# Patient Record
Sex: Male | Born: 1965 | Race: Black or African American | Hispanic: No | Smoking: Current some day smoker
Health system: Southern US, Community
[De-identification: ages and names within clinical notes are randomized; demographics above are authoritative.]

## PROBLEM LIST (undated history)

## (undated) DIAGNOSIS — I10 Essential (primary) hypertension: Secondary | ICD-10-CM

## (undated) DIAGNOSIS — E785 Hyperlipidemia, unspecified: Secondary | ICD-10-CM

## (undated) DIAGNOSIS — N200 Calculus of kidney: Secondary | ICD-10-CM

## (undated) DIAGNOSIS — M1990 Unspecified osteoarthritis, unspecified site: Secondary | ICD-10-CM

## (undated) HISTORY — DX: Hyperlipidemia, unspecified: E78.5

## (undated) HISTORY — DX: Unspecified osteoarthritis, unspecified site: M19.90

## (undated) HISTORY — PX: KIDNEY STONE SURGERY: SHX686

---

## 1998-03-14 ENCOUNTER — Emergency Department (HOSPITAL_COMMUNITY): Admission: EM | Admit: 1998-03-14 | Discharge: 1998-03-14 | Payer: Self-pay | Admitting: Emergency Medicine

## 1999-05-19 ENCOUNTER — Emergency Department (HOSPITAL_COMMUNITY): Admission: EM | Admit: 1999-05-19 | Discharge: 1999-05-19 | Payer: Self-pay | Admitting: Emergency Medicine

## 2000-09-03 ENCOUNTER — Emergency Department (HOSPITAL_COMMUNITY): Admission: EM | Admit: 2000-09-03 | Discharge: 2000-09-03 | Payer: Self-pay | Admitting: Emergency Medicine

## 2001-10-29 ENCOUNTER — Encounter: Payer: Self-pay | Admitting: Internal Medicine

## 2001-10-29 ENCOUNTER — Inpatient Hospital Stay (HOSPITAL_COMMUNITY): Admission: EM | Admit: 2001-10-29 | Discharge: 2001-10-31 | Payer: Self-pay | Admitting: Emergency Medicine

## 2001-10-29 ENCOUNTER — Encounter: Payer: Self-pay | Admitting: General Surgery

## 2001-10-29 ENCOUNTER — Encounter: Payer: Self-pay | Admitting: Emergency Medicine

## 2001-10-29 ENCOUNTER — Emergency Department (HOSPITAL_COMMUNITY): Admission: EM | Admit: 2001-10-29 | Discharge: 2001-10-29 | Payer: Self-pay | Admitting: Emergency Medicine

## 2001-10-30 ENCOUNTER — Encounter: Payer: Self-pay | Admitting: Internal Medicine

## 2001-10-31 ENCOUNTER — Encounter: Payer: Self-pay | Admitting: Internal Medicine

## 2002-03-30 ENCOUNTER — Encounter: Payer: Self-pay | Admitting: Emergency Medicine

## 2002-03-30 ENCOUNTER — Emergency Department (HOSPITAL_COMMUNITY): Admission: EM | Admit: 2002-03-30 | Discharge: 2002-03-30 | Payer: Self-pay | Admitting: Emergency Medicine

## 2002-04-02 ENCOUNTER — Encounter: Payer: Self-pay | Admitting: Gastroenterology

## 2002-04-02 ENCOUNTER — Encounter: Payer: Self-pay | Admitting: *Deleted

## 2002-04-02 ENCOUNTER — Ambulatory Visit (HOSPITAL_COMMUNITY): Admission: RE | Admit: 2002-04-02 | Discharge: 2002-04-02 | Payer: Self-pay | Admitting: *Deleted

## 2002-04-02 ENCOUNTER — Inpatient Hospital Stay (HOSPITAL_COMMUNITY): Admission: EM | Admit: 2002-04-02 | Discharge: 2002-04-06 | Payer: Self-pay | Admitting: Emergency Medicine

## 2004-01-24 ENCOUNTER — Emergency Department (HOSPITAL_COMMUNITY): Admission: EM | Admit: 2004-01-24 | Discharge: 2004-01-24 | Payer: Self-pay | Admitting: Family Medicine

## 2004-11-10 ENCOUNTER — Emergency Department (HOSPITAL_COMMUNITY): Admission: EM | Admit: 2004-11-10 | Discharge: 2004-11-10 | Payer: Self-pay | Admitting: *Deleted

## 2005-11-23 ENCOUNTER — Encounter: Payer: Self-pay | Admitting: Emergency Medicine

## 2006-02-02 ENCOUNTER — Emergency Department (HOSPITAL_COMMUNITY): Admission: EM | Admit: 2006-02-02 | Discharge: 2006-02-02 | Payer: Self-pay | Admitting: Emergency Medicine

## 2007-01-11 ENCOUNTER — Emergency Department (HOSPITAL_COMMUNITY): Admission: EM | Admit: 2007-01-11 | Discharge: 2007-01-11 | Payer: Self-pay | Admitting: Family Medicine

## 2010-05-01 ENCOUNTER — Emergency Department (HOSPITAL_BASED_OUTPATIENT_CLINIC_OR_DEPARTMENT_OTHER): Admission: EM | Admit: 2010-05-01 | Discharge: 2010-05-01 | Payer: Self-pay | Admitting: Emergency Medicine

## 2010-10-02 ENCOUNTER — Inpatient Hospital Stay (INDEPENDENT_AMBULATORY_CARE_PROVIDER_SITE_OTHER)
Admission: RE | Admit: 2010-10-02 | Discharge: 2010-10-02 | Disposition: A | Discharge status: Home / Self Care | Payer: Self-pay | Source: Ambulatory Visit | Attending: Emergency Medicine | Admitting: Emergency Medicine

## 2010-10-02 DIAGNOSIS — L509 Urticaria, unspecified: Secondary | ICD-10-CM

## 2010-10-07 LAB — URINALYSIS, ROUTINE W REFLEX MICROSCOPIC
Bilirubin Urine: NEGATIVE
Ketones, ur: NEGATIVE mg/dL
Nitrite: NEGATIVE
Protein, ur: NEGATIVE mg/dL
Urobilinogen, UA: 0.2 mg/dL (ref 0.0–1.0)

## 2010-10-07 LAB — RPR: RPR Ser Ql: NONREACTIVE

## 2010-10-07 LAB — URINE CULTURE

## 2010-10-07 LAB — GC/CHLAMYDIA PROBE AMP, GENITAL
Chlamydia, DNA Probe: NEGATIVE
GC Probe Amp, Genital: POSITIVE — AB

## 2010-12-10 NOTE — Consult Note (Signed)
[place]. [facility]  Patient:    KETRICK, MATNEY Visit Number: [id number] MRN: [id number]          Service Type: EMS Location: [place] Attending Physician:  Cathren Laine Dictated by:   Roosvelt Harps, M.D. Proc. Date: 10/29/01 Admit Date:  10/29/2001   CC:         Dineen Kid. Reche Dixon, M.D.   Consultation Report  HISTORY OF PRESENT ILLNESS:  Mr. Shellhammer is a 45 year old male whom I am asked to see by Drs. Rama and Reche Dixon for severe abdominal pain.  Patient is a relatively poor historian.  He has variously told people that the pain is in the epigastrium, the left upper quadrant, the right lower quadrant, and the right upper quadrant.  To me, he says the pain is diffuse.  It began acutely this morning and was followed by nausea and vomiting.  He was seen in the emergency room where a CT scan was performed and was read as negative.  I have personally reviewed the CT scan with the radiologist and confirmed the negative findings.  This morning, his white blood count was 9.  It has risen to 13.  The pain has persisted and he has had nausea and vomiting, but he has had no diarrhea, melena, hematochezia, or hematemesis.  Rectal exam was guaiac negative earlier today.  He denies any prior history of similar pain.  He has no history of ulcer disease.  He says that no one at home is sick and he ate no questionable foods recently.  he also says he has not been drinking any alcohol or used any recent drugs.  He denies dysphagia, weight loss, or other chronic GI problems.  PAST MEDICAL HISTORY:  Fairly unremarkable except for a history of polysubstance abuse inactive for the past year.  FAMILY HISTORY:  Negative for ulcers, gallstones, inflammatory bowel disease, or colorectal neoplasia.  SOCIAL HISTORY:  He is married with three children, lives at home, smokes a half a pack of cigarettes per week, has used cocaine and alcohol in the past but none in the past  year.  REVIEW OF SYSTEMS:  GENERAL:  No weight loss or night sweats.  SKIN:  No rash or pruritus.  EYES:  No icterus or change in vision.  ENT:  No aphthous ulcers or chronic sore throat.  RESPIRATORY:  No shortness of breath, cough, or wheezing.  CARDIAC:  No chest pain, palpitations, or history of valvular heart disease.  GI:  As above.  GU:  No dysuria or hematuria.  Remainder of review of systems is negative.  PHYSICAL EXAMINATION:  GENERAL:  He is a well-developed, well-nourished adult male in moderate distress having a shaking chill at the time of my exam.  VITAL SIGNS:  He has no fever currently; however, blood pressure is 149/77, pulse 63.  SKIN:  Normal.  HEENT:  Eyes are anicteric.  Conjunctivae pink.  Oropharynx unremarkable. There is no cervical or inguinal adenopathy.  CHEST:  Chest sounds are clear.  HEART:  Heart sounds are regular rate and rhythm.  ABDOMEN:  Soft with minimal guarding and diffuse tenderness to deep palpation. There may be questionable diffuse mild rebound.  There are hypoactive bowel sounds.  Murphys sign is negative.  RECTAL:  Exam is not repeated.  EXTREMITIES:  Without cyanosis, clubbing, edema, or rash.  LABORATORY TESTS:  Hemoglobin 15, white blood count 13, platelet count normal. Potassium was 3.3.  Remainder of electrolytes, BUN, creatinine, blood sugar are normal.  Total bilirubin was 1.7, but all other liver function tests are normal.  Amylase and lipase are normal.  Urinalysis is negative.  IMPRESSION:  A 45 year old male with a probable acute infectious gastroenteritis.  Other unlikely possibilities would be cholecystitis or a penetrating ulcer.  In light of his lack of nonsteroidal use and no history of prior ulcer disease and guaiac-negative stool with normal hemoglobin and no hematemesis, I am extremely doubtful that he has acute ulcer disease at this time.  RECOMMENDATIONS:  I agree with keeping him on IV Protonix and  observation and hydration.  If his abdominal symptoms continue, a three-way abdominal series should be ordered.  If his white blood count continues to rise and/or he has fevers, blood cultures and coverage with antibiotics would be in order.  If there are any further abdominal signs on physical exam or a progression to overt rebound, surgical consultation is in order.  I will follow the patient along with you, but I doubt that any endoscopic procedures are urgently indicated.Dictated by:   Roosvelt Harps, M.D. Attending Physician:  Cathren Laine DD:  10/29/01 TD:  10/29/01 Job: [id number]

## 2010-12-10 NOTE — H&P (Signed)
NAME:  Timothy Harrington, Timothy Harrington                         ACCOUNT NO.:  [id number]   MEDICAL RECORD NO.:  [id number]                   PATIENT TYPE:  INP   LOCATION:  5502                                 FACILITY:  [facility]   PHYSICIAN:  James L. Malon Kindle., M.D.          DATE OF BIRTH:  Sep 19, 1965   DATE OF ADMISSION:  04/02/2002  DATE OF DISCHARGE:                                HISTORY & PHYSICAL   REASON FOR ADMISSION:  Abdominal pain.   HISTORY OF PRESENT ILLNESS:  A 45 year old black male who has been evaluated  by Dr. Luther Parody for abdominal pain with fluctuating LFTs.  He had an ERCP  today with, I believe, a sphincterotomy according to the patient's wife.  I  am not sure if stones were found.  There was some question about his  gallbladder needing to be removed.  I do not have those records currently  available to me.  He went home following the procedure after he had been  observed for two to three hours in the endoscopy suite and apparently was  doing fine.  He went home, was sleepy and groggy according to his wife, and  he went to sleep. When he woke up, he was experiencing abdominal pain that  has gotten progressively worse over the past several hours.  He is  nauseated.  The pain does not radiate to the back, seems to be in the  epigastrium and quite severe.  He has not eaten anything, although he is  quite thirsty.  His wife notes he has urinated a couple of times since being  home.   CURRENT MEDICATIONS:  URSO, samples just given to him by Dr. Luther Parody.  He  takes no other medicines chronically.   ALLERGIES:  No known drug allergies   PAST MEDICAL HISTORY:  No previous surgeries.  No chronic medical problems.   FAMILY HISTORY:  His parents are both living and reasonably healthy.   SOCIAL HISTORY:  He is married, works in [employer].  Drank socially  in the past, has not had any alcohol in one to two years.  He smoked in the  past but has not smoked recently.   REVIEW OF SYSTEMS:  Remarkable for lack of any other chronic medical  symptoms or problems.  He has had fluctuating abdominal pain that comes and  goes for sometime.   PHYSICAL EXAMINATION:  VITAL SIGNS:  The patient is afebrile with stable  vital signs.  GENERAL:  Pleasant black male who is nonicteric.  He is alert and oriented.  HEART:  Regular rate and rhythm with no murmur, rub, or gallop.  LUNGS:  Clear.  ABDOMEN:  Distended, quiet, diffusely tender mostly in the epigastrium.  The  patient does have guarding.   LABORATORY DATA:  All labs are currently pending.    ASSESSMENT:  Severe epigastric pain following an endoscopic retrograde  cholangiopancreatography, almost certainly pancreatitis.  Other  possibilities would include perforation due to the procedure, bleeding;  however, I think those are much less likely.  I have discussed this in  detail with the patient and his wife.   PLAN:  Will admit, pain control. Will check his labs.  Will obtain an acute  abdominal series to rule out free air.                                               James L. Malon Kindle., M.D.    Waldron Session  D:  04/02/2002  T:  04/03/2002  Job:  [id number]

## 2010-12-10 NOTE — Discharge Summary (Signed)
   NAME:  Timothy Harrington, Timothy Harrington                         ACCOUNT NO.:  [id number]   MEDICAL RECORD NO.:  [id number]                   PATIENT TYPE:  INP   LOCATION:  5502                                 FACILITY:  [facility]   PHYSICIAN:  Althea Grimmer. Luther Parody, M.D.            DATE OF BIRTH:  04/21/1966   DATE OF ADMISSION:  04/02/2002  DATE OF DISCHARGE:  04/06/2002                                 DISCHARGE SUMMARY   DISCHARGE DIAGNOSES:  1. Post endoscopic retrograde cholangiopancreatography pancreatitis.  2. History of passage of probable small gallstones.   HISTORY OF PRESENT ILLNESS:  The patient was admitted the evening after  undergoing an ERCP.  He had been observed for several hours in the Endoscopy  Unit and appeared to be fine.  However, he returned complaining of abdominal  pain with nausea.  On physical exam, he was afebrile with stable vital  signs, eyes were anicteric, and abdomen was distended with hypoactive bowel  sounds and guarding.  He was admitted to the hospital for hydration and pain  medication.   HOSPITAL COURSE:  He received intravenous and then oral narcotics with  relief of pain.  Diet was progressively advanced from clear liquids to a  full diet.  At the time of discharge, he was not requiring any pain  medication.   LABORATORY DATA:  On discharge his hemoglobin was 14.6 with a white blood  count of 10.2.  Serum electrolytes were normal.  BUN 5.  Albumin 3 down from  an admission level of 3.6.  Discharge AST was 44, down from 88.  ALT was  141, down from 265.  Alkaline phosphatase 148, down from 155.  Total  bilirubin 2.2, down from 5.5.  Amylase was 205, down from 2317.  Lipase was  52.   DISCHARGE CONDITION:  Improved.   DISCHARGE MEDICATIONS:  The patient was sent home on ursodeoxycholic acid to  hopefully prevent further gallstone formation.   FOLLOW UP:  He is advised to followup in the office within 10 days.     Althea Grimmer. Luther Parody, M.D.    PJS/MEDQ  D:  04/17/2002  T:  04/20/2002  Job:  [id number]

## 2010-12-10 NOTE — Op Note (Signed)
NAME:  MEREDITH, MELLS                         ACCOUNT NO.:  [id number]   MEDICAL RECORD NO.:  [id number]                   PATIENT TYPE:  INP   LOCATION:  5502                                 FACILITY:  [facility]   PHYSICIAN:  Althea Grimmer. Luther Parody, M.D.            DATE OF BIRTH:  12-20-1965   DATE OF PROCEDURE:  04/02/2002  DATE OF DISCHARGE:                                 OPERATIVE REPORT   PROCEDURE:  Endoscopic retrograde cholangiopancreatography with  sphincterotomy.   INDICATIONS:  A 44 year old male with recurrent severe epigastric pain and  abnormal liver function tests highly suggestive of passage of small common  bile duct stones.   PREPARATION:  He is NPO since midnight.   PREPROCEDURE MEDICATIONS:  He received a total of 130 mg of Demerol and 13  mg of Versed prior to and during the procedure.  He was also given  ciprofloxacin 400 mg IV prior to the procedure and received Glucagon 0.25 mg  during the procedure.  His throat was anesthetized with Cetacaine spray.   DESCRIPTION OF PROCEDURE:  The Olympus video duodenoscope was inserted via  the mouth and advanced easily through the upper esophageal sphincter into  the stomach and then into the duodenum.  The ampulla was visualized and  cannulated, with the sphincterotome pre-loaded with the guidewire.  Several  brief injections were made into the pancreatic duct.  No secondary ducts  were filled.  The pancreatogram appeared normal.  The cannula was  repositioned and the common bile duct was entered.  The guidewire was  advanced well into a right hepatic duct and a small to moderate  sphincterotomy performed.  A balloon catheter exchange with an 8 mm balloon  was made, and this was advanced to the bifurcation.  The balloon was  inflated, and dye was injected.  This was swept through the common bile duct  twice without any obvious stones being delivered.  The cholangiogram  appeared normal, and this was reviewed with Arnell Sieving, M.D., in  radiology.  The patient tolerated the procedure well.  Pulse, blood  pressure, and oximetry testing remained stable throughout.  He will be  observed in recovery for several hours before a decision is made regarding  admission versus discharge home.   IMPRESSION:  Status post endoscopic retrograde cholangiopancreatography with  sphincterotomy for history of probable passage of small common bile duct  stones.  Cholangiogram, however, appears normal.   PLAN:  If the patient is discharged, he will be seen within the week in the  office.  He will be discharged on ursodeoxycholic acid and a low-fat diet.  He is advised to have surgical consultation, which he is currently  deferring.  Althea Grimmer. Luther Parody, M.D.    PJS/MEDQ  D:  04/02/2002  T:  04/03/2002  Job:  [id number]

## 2010-12-10 NOTE — Discharge Summary (Signed)
[place]  Patient:    Timothy Harrington, Timothy Harrington Visit Number: [id number] MRN: [id number]          Service Type: MED Location: [phone] Attending Physician:  Alfonso Ramus Dictated by:   Hillery Aldo, M.D. Admit Date:  10/29/2001 Discharge Date: 10/31/2001   CC:         Dr. Derrill Memo GI  Outpatient Clinic/[place]   Discharge Summary  DISCHARGE DIAGNOSES: 1. Abdominal pain of uncertain etiology. 2. Hypokalemia. 3. Nausea and vomiting. 4. Fever.  DISCHARGE MEDICATIONS WITH DOSAGES: 1. Augmentin 875 mg p.o. b.i.d. times seven days. 2. Zithromax 250 mg p.o. q.d. times three days. 3. Omeprazole 20 mg p.o. q.d. times 30 days.  FOLLOW-UP APPOINTMENTS: 1. The patient will follow-up at the outpatient clinic on Monday, November 05, 2001 at 2 p.m. for hospital follow-up. 2. Additionally he is instructed to call Dr. Henderson [place] office for gastrointestinal follow-up.  DISPOSITION: The patient is discharged home in stable condition.  ACTIVITY:  He is instructed to resume activity as tolerated.  DIET:  He is instructed to resume diet as tolerated.  SPECIAL INSTRUCTIONS: He understands that he should return to the ER or call the clinic office if he has worsening abdominal pain or fever.  PROCEDURES AND DIAGNOSTIC STUDIES: 1. October 29, 2001:  CT scan of the abdomen -  no active disease, negative for    appendicitis. 2. October 29, 2001:  Three way abdomen - no free air, no active disease. 3. October 29, 2001:  Repeat CT scan of the abdomen - no active abdominal    process.  Right middle lobe consolidation.  CONSULTATIONS: 1. Dr. Angelene Giovanni, Gastroenterology. 2. Dr. Derrell Lolling, Surgery.  BRIEF ADMISSION HISTORY AND PHYSICAL: The patient is a 45 year-old old African-American male who awoke the morning of admission at 3 a.m. with complaints of severe abdominal pain.  He was treated in the emergency department with antiemetics,  pain medications and discharged after the first CT scan revealed no active abdominal pathology. He was instructed to return to the emergency department if he developed any worsening of symptoms or nausea and vomiting.  He returned approximately two hours later with ongoing complaints of severe abdominal pain and nausea with vomiting up the contrast material he had for his CT scan.  The patient states his pain is 10/10 and is mid epigastric, constant but varies in its intensity. The patient describes the pain as being of dull quality.  The patient denied any hematemesis, melena, hematochezia or ever having had similar pain. The patient denied any sick contacts. No significant past medical history or abdominal surgery.  PHYSICAL EXAMINATION:  VITAL SIGNS:  The patient was diaphoretic with vital signs of a temperature of 98.2, a pulse of 63, a blood pressure of 149/77 and respirations of 22.  GENERAL:  Diaphoretic black male in moderate distress.  HEENT:  Normocephalic/atraumatic.  PERRRL.  EOMI.  Oropharynx is clear.  NECK:  Supple without thyromegaly, lymphadenopathy or bruits.  CHEST:  Decreased breath sounds but clear.  HEART:  Regular rate and rhythm.  No murmurs, rubs or gallops.  ABDOMEN:  Slightly distended and diffusely tender particularly over the left upper quadrant and mid epigastrium.  There is positive guarding but no rebound.  Bowel sounds are present in all four quadrants.  RECTAL:  Good tone, heme negative stool.  EXTREMITIES:  No clubbing, edema or cyanosis.  ADMISSION LABORATORY DATA:  WBC 9.5, hemoglobin 15.1, hematocrit 42.0 and  platelet count of 161,000.  Chemistries:  Sodium 142, potassium 3.3, chloride 104, bicarb 28, BUN 11, creatinine 0.3, glucose 116, ALT 21, AST 35, alkaline phosphatase 52, total bilirubin 1.7, albumin 3.9.  His lipase was 26.  Amylase was 106.  Blood cultures were negative.  His PT was 14.2, INR 1.1, PTT 32.0. HIV test was  negative.  HOSPITAL COURSE BY PROBLEM: #1 - ABDOMINAL PAIN:  The patient remained in severe abdominal pain after admission prompting [place] to get a consult from GI to see if he would need an upper endoscopy.  GIs recommendation was to continue to treat him with a PPI and observation along with IV hydration.  Their impression was that the patient had acute infectious gastroenteritis. Also in the differential would be cholecystitis or a penetrating ulcer.  Because the patient did not use nonsteroidal anti-inflammatory medications and had no history of prior ulcer disease and because the patient had guaiac negative stool with a normal hemoglobin, it was considered doubtful that he had any active ulcer disease at the time he was evaluated.  At 10 p.m., the patients temperature spiked to 102.0  His abdominal exam was fairly unchanged. He remained distended with positive guarding but limited rebound.  His pain, however, was now more in the mid to right upper quadrant area and his bowel sounds diminished in intensity. Based on these clinical exam changes, a surgical consult was obtained. Surgery also agreed with GIs evaluation that the patient had a gastroenteritis but could not exclude the small right middle lobe infiltrate seen on the CT scan as being the etiology of the patients pain.  He was treated with pain medications and continued hydration overnight and on the morning of October 30, 2001 the patient began to feel somewhat better.  He defervesced.  He was able to tolerate clear liquids without difficulty.  He was started empirically on ceftazidime with a resultant decrease in his white blood cell count.  Also, Zosyn and Zithromax were added to his coverage to treat any community acquired or atypical source of his potential pneumonia. The patient improved dramatically over the next 24 hours and had a bowel  movement on the 9th. He was able to continue to tolerate p.o.s well.  At the time of  his discharge, he continues to have a hepatitis panel, an EBV and CMV titers pending.  He will follow-up in the outpatient clinic next week to reevaluate his condition and to review his laboratory data.  #2 - FEVER:  His blood cultures remained negative at the time of his discharge but were not finalized. We will ascertain that there is no growth over the next few days.  He will be discharged on Augmentin and Zithromax.  #3 - HYPOKALEMIA:  The patients low potassium was repleted.  At the time of his discharge his potassium had normalized to 3.9. Dictated by:   Hillery Aldo, M.D. Attending Physician:  Alfonso Ramus DD:  10/31/01 TD:  11/01/01 Job: [id number]

## 2010-12-10 NOTE — H&P (Signed)
NAME:  Timothy Harrington, Timothy Harrington                         ACCOUNT NO.:  [id number]   MEDICAL RECORD NO.:  [id number]                   PATIENT TYPE:  EMS   LOCATION:  MINO                                 FACILITY:  [facility]   PHYSICIAN:  Althea Grimmer. Luther Parody, M.D.            DATE OF BIRTH:  Jan 24, 1966   DATE OF ADMISSION:  03/30/2002  DATE OF DISCHARGE:  03/30/2002                                HISTORY & PHYSICAL   HISTORY:  [name] is a 45 year old male whom I first saw in April of  this year when he presented to the emergency room with severe abdominal pain  which he could characterize poorly.  He had both upper and lower abdominal  pain with nausea and vomiting.  CT scan was performed and was negative.  His  white blood count, at that time, was 9 and rose to 13, and he developed a  fever.  He was treated with antibiotics and symptoms gradually resolved.  It  was felt that he probably had a gastroenteritis.  A repeat CT scan suggested  a right middle lobe pneumonia.  Surgical consultation at that time also felt  that his symptoms were suggestive of a gastroenteritis.  Liver function  tests were normal.  Total bilirubin 1.7.  Amylase and lipase were normal.  Blood cultures were negative.  He, apparently, has been doing well in the  interim until this weekend when he developed severe epigastric pain which  felt similar to the last time by his report, but this time in came in waves  and was colicky.  He did not have nausea and vomiting with this.  He had no  diarrhea, and has not developed a fever.  He was placed on oxycodone in the  emergency room, and the pain has been manageable since that time.  In the  emergency room, hemoglobin was 16 and white blood count 9.3, platelet count  normal.  Urinalysis was positive for urobilinogen.  Ultrasonogram  demonstrate a contracted gallbladder with a common bile duct that was mildly  dilated at 6.5 mm.  His total bilirubin was 1.8 on 09/06.  Today, it is  4.9.  His alkaline phosphatase was 111.  Today, it is 202.  SGOT went from 450 to  189.  SGPT went from 260 to 464.  GGT today is 502.  Lipase is normal.   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  HIV test recently was negative.  Hepatitis B IgM was positive in April.   MEDICATIONS:  Only oxycodone.   ALLERGIES:  None reported.   FAMILY HISTORY:  Negative for ulcers, gallstones, inflammatory bowel  disease, or colorectal neoplasia.   SOCIAL HISTORY:  Married with three young children.  Nonsmoker.  Nondrinker.   REVIEW OF SYSTEMS:  General:  No weight loss or night sweats.  ENDOCRINE:  No history  of diabetes or thyroid problems.  SKIN:  No rash or pruritus.  EYES:  No noted icterus or change in vision.  ENT:  No aphthous ulcers or  chronic sore throat.  RESPIRATORY:  No shortness of breath, cough, or  wheezing.  CARDIAC:  No chest pain, palpitations, or history of valvular  heart disease.  GI:  As above.  GU:  No dysuria or hematuria.  Remainder of  the review of systems is negative.   PHYSICAL EXAMINATION:  He is a well-developed, well-nourished, adult male  currently in no acute distress.  Mildly lethargic from oxycodone.  He  appears to have lost some weight since April.  Afebrile, blood pressure  120/80, pulse is 60 and regular.  SKIN:  Normal.  EYES:  Mildly icteric.  OROPHARYNX:  Unremarkable.  NECK:  Supple without thyromegaly.  ADENOPATHY:  There is no cervical or inguinal adenopathy.  CHEST:  Clear.  Heart sounds are regular rate and rhythm without murmurs,  rubs, or gallops.  ABDOMEN:  Soft with minimal right epigastric tenderness to deep palpation.  There are no masses, rebound, or hernia.  RECTAL EXAM:  Not performed.  EXTREMITIES:  Without cyanosis, clubbing, edema, or rash.   IMPRESSION:  A 45 year old male with probable biliary colic with  choledocholithiasis.   PLAN:  I think the  patient requires ERCP for clearance of his duct and  possibly surgical consultation for cholecystectomy.  The above is reviewed  with the patient and the procedure discussed in terms of technique,  preparation, and risk of complications including bleeding and perforation.  He agrees to proceed, and it will be performed tomorrow morning.  Please see  the orders.                                               Althea Grimmer. Luther Parody, M.D.    PJS/MEDQ  D:  04/01/2002  T:  04/02/2002  Job:  [id number]

## 2011-05-11 LAB — POCT URINALYSIS DIP (DEVICE)
Glucose, UA: NEGATIVE
Nitrite: NEGATIVE
Operator id: 239701
Protein, ur: NEGATIVE
Specific Gravity, Urine: >=1.03
Urobilinogen, UA: 1
pH: 5.5

## 2012-07-06 ENCOUNTER — Emergency Department (HOSPITAL_COMMUNITY): Payer: Managed Care, Other (non HMO)

## 2012-07-06 ENCOUNTER — Emergency Department (HOSPITAL_COMMUNITY)
Admission: EM | Admit: 2012-07-06 | Discharge: 2012-07-06 | Disposition: A | Discharge status: Home Health | Payer: Managed Care, Other (non HMO) | Attending: Emergency Medicine | Admitting: Emergency Medicine

## 2012-07-06 ENCOUNTER — Encounter (HOSPITAL_COMMUNITY): Payer: Self-pay | Admitting: *Deleted

## 2012-07-06 DIAGNOSIS — F17200 Nicotine dependence, unspecified, uncomplicated: Secondary | ICD-10-CM | POA: Insufficient documentation

## 2012-07-06 DIAGNOSIS — R062 Wheezing: Secondary | ICD-10-CM | POA: Insufficient documentation

## 2012-07-06 DIAGNOSIS — R112 Nausea with vomiting, unspecified: Secondary | ICD-10-CM | POA: Insufficient documentation

## 2012-07-06 DIAGNOSIS — B9789 Other viral agents as the cause of diseases classified elsewhere: Secondary | ICD-10-CM | POA: Insufficient documentation

## 2012-07-06 DIAGNOSIS — J3489 Other specified disorders of nose and nasal sinuses: Secondary | ICD-10-CM | POA: Insufficient documentation

## 2012-07-06 DIAGNOSIS — H53149 Visual discomfort, unspecified: Secondary | ICD-10-CM | POA: Insufficient documentation

## 2012-07-06 DIAGNOSIS — B349 Viral infection, unspecified: Secondary | ICD-10-CM

## 2012-07-06 DIAGNOSIS — H9209 Otalgia, unspecified ear: Secondary | ICD-10-CM | POA: Insufficient documentation

## 2012-07-06 DIAGNOSIS — R509 Fever, unspecified: Secondary | ICD-10-CM | POA: Insufficient documentation

## 2012-07-06 DIAGNOSIS — R059 Cough, unspecified: Secondary | ICD-10-CM | POA: Insufficient documentation

## 2012-07-06 DIAGNOSIS — F1010 Alcohol abuse, uncomplicated: Secondary | ICD-10-CM | POA: Insufficient documentation

## 2012-07-06 DIAGNOSIS — R05 Cough: Secondary | ICD-10-CM | POA: Insufficient documentation

## 2012-07-06 LAB — CBC WITH DIFFERENTIAL/PLATELET
Basophils Absolute: 0 10*3/uL (ref 0.0–0.1)
Basophils Relative: 0 % (ref 0–1)
Eosinophils Absolute: 0 10*3/uL (ref 0.0–0.7)
Eosinophils Relative: 0 % (ref 0–5)
HCT: 41.8 % (ref 39.0–52.0)
Hemoglobin: 15.7 g/dL (ref 13.0–17.0)
Lymphocytes Relative: 16 % (ref 12–46)
Lymphs Abs: 1.6 10*3/uL (ref 0.7–4.0)
MCH: 29.8 pg (ref 26.0–34.0)
MCHC: 37.6 g/dL — ABNORMAL HIGH (ref 30.0–36.0)
MCV: 79.5 fL (ref 78.0–100.0)
Monocytes Absolute: 0.4 10*3/uL (ref 0.1–1.0)
Monocytes Relative: 4 % (ref 3–12)
Neutro Abs: 7.5 10*3/uL (ref 1.7–7.7)
Neutrophils Relative %: 79 % — ABNORMAL HIGH (ref 43–77)
Platelets: 164 10*3/uL (ref 150–400)
RBC: 5.26 MIL/uL (ref 4.22–5.81)
RDW: 13.4 % (ref 11.5–15.5)
WBC: 9.6 10*3/uL (ref 4.0–10.5)

## 2012-07-06 LAB — COMPREHENSIVE METABOLIC PANEL
ALT: 33 U/L (ref 0–53)
AST: 35 U/L (ref 0–37)
Albumin: 4.2 g/dL (ref 3.5–5.2)
Alkaline Phosphatase: 96 U/L (ref 39–117)
BUN: 9 mg/dL (ref 6–23)
CO2: 24 mEq/L (ref 19–32)
Calcium: 9.3 mg/dL (ref 8.4–10.5)
Chloride: 98 mEq/L (ref 96–112)
Creatinine, Ser: 1.32 mg/dL (ref 0.50–1.35)
GFR calc Af Amer: 72 mL/min — ABNORMAL LOW (ref >90)
GFR calc non Af Amer: 62 mL/min — ABNORMAL LOW (ref >90)
Glucose, Bld: 108 mg/dL — ABNORMAL HIGH (ref 70–99)
Potassium: 3.7 mEq/L (ref 3.5–5.1)
Sodium: 134 mEq/L — ABNORMAL LOW (ref 135–145)
Total Bilirubin: 1.2 mg/dL (ref 0.3–1.2)
Total Protein: 8.3 g/dL (ref 6.0–8.3)

## 2012-07-06 LAB — URINALYSIS, ROUTINE W REFLEX MICROSCOPIC
Bilirubin Urine: NEGATIVE
Glucose, UA: NEGATIVE mg/dL
Hgb urine dipstick: NEGATIVE
Ketones, ur: 15 mg/dL — AB
Leukocytes, UA: NEGATIVE
Nitrite: NEGATIVE
Protein, ur: NEGATIVE mg/dL
Specific Gravity, Urine: 1.024 (ref 1.005–1.030)
Urobilinogen, UA: 1 mg/dL (ref 0.0–1.0)
pH: 7 (ref 5.0–8.0)

## 2012-07-06 MED ORDER — MORPHINE SULFATE 4 MG/ML IJ SOLN
4.0000 mg | Freq: Once | INTRAMUSCULAR | Status: AC
  Administered 2012-07-06: 4 mg via INTRAVENOUS
  Filled 2012-07-06: qty 1

## 2012-07-06 MED ORDER — SODIUM CHLORIDE 0.9 % IV BOLUS (SEPSIS)
1000.0000 mL | Freq: Once | INTRAVENOUS | Status: AC
  Administered 2012-07-06: 1000 mL via INTRAVENOUS

## 2012-07-06 MED ORDER — PROMETHAZINE HCL 25 MG PO TABS: 25.0000 mg | ORAL_TABLET | Freq: Four times a day (QID) | ORAL | Status: DC | PRN

## 2012-07-06 MED ORDER — ONDANSETRON HCL 4 MG/2ML IJ SOLN
4.0000 mg | Freq: Once | INTRAMUSCULAR | Status: AC
  Administered 2012-07-06: 4 mg via INTRAVENOUS
  Filled 2012-07-06: qty 2

## 2012-07-06 MED ORDER — HYDROCODONE-ACETAMINOPHEN 5-325 MG PO TABS: 1.0000 | ORAL_TABLET | Freq: Four times a day (QID) | ORAL | Status: DC | PRN

## 2012-07-06 MED ORDER — KETOROLAC TROMETHAMINE 30 MG/ML IJ SOLN
30.0000 mg | Freq: Once | INTRAMUSCULAR | Status: AC
  Administered 2012-07-06: 30 mg via INTRAVENOUS
  Filled 2012-07-06: qty 1

## 2012-07-06 NOTE — ED Notes (Signed)
Pt has multiple complaints.  Reports that he has had diarrhea x 4 days, states that he has been nauseated but not vomited.  States that his head is hurting and that his upper back and chest is hurting also.  States that he feels like he has a cold, has been having a non-productive cough x 10 days.

## 2012-07-06 NOTE — ED Notes (Signed)
Pt ate 50% of lunch tray

## 2012-07-06 NOTE — ED Provider Notes (Signed)
1History     CSN: [id number]  Arrival date & time 07/06/12  0803   First MD Initiated Contact with Patient 07/06/12 [phone]      Chief Complaint  Patient presents with  . Headache    (Consider location/radiation/quality/duration/timing/severity/associated sxs/prior treatment) HPI  The patient presents to the ED with headache for 3 days.  He reports Right sided and post orbital headache with photophobia. He reports 10 days of nasal congestion, Right ear pain, cough, and "not feeling well".  He reports multiple episodes of vomiting several days ago and is nauseated. He reports several episodes of diarrhea which stopped 3 days ago.  He reports subjective fever with chills. Reports wheezing, denies dyspnea. He reports last ETOH use was 7 days ago and 'have not been able to drink", denies cocaine use. The patient denies chest pain, visual changes, weakness, numbness, earache, dizziness, syncope, or dysuria.   History reviewed. No pertinent past medical history.  History reviewed. No pertinent past surgical history.  History reviewed. No pertinent family history.  History  Substance Use Topics  . Smoking status: Current Every Day Smoker -- 0.5 packs/day    Types: Cigarettes  . Smokeless tobacco: Not on file  . Alcohol Use: Yes     Comment: socially      Review of Systems All other systems negative except as documented in the HPI. All pertinent positives and negatives as reviewed in the HPI.  Allergies  Review of patient's allergies indicates no known allergies.  Home Medications   Current Outpatient Rx  Name  Route  Sig  Dispense  Refill  . TYLENOL PO   Oral   Take 2 tablets by mouth every 6 (six) hours as needed. For pain           BP 164/102  Resp 17  SpO2 98%  Physical Exam  Nursing note and vitals reviewed. Constitutional: He appears well-developed and well-nourished.  HENT:  Head: Normocephalic and atraumatic. Head is without contusion.  Right Ear: Tympanic  membrane normal.  Left Ear: Tympanic membrane normal.  Neck: Normal range of motion. Neck supple. No spinous process tenderness present.  Cardiovascular: Normal rate, regular rhythm and normal heart sounds.   Pulmonary/Chest: Effort normal. He has no decreased breath sounds. He has no wheezes. He has no rales.  Abdominal: Soft. Normal appearance and bowel sounds are normal. There is generalized tenderness. There is no rebound.  Lymphadenopathy:       Head (right side): No submental, no submandibular, no tonsillar, no preauricular, no posterior auricular and no occipital adenopathy present.       Head (left side): No submental, no submandibular, no tonsillar, no preauricular, no posterior auricular and no occipital adenopathy present.    He has no cervical adenopathy.  Neurological: He is alert.  Skin: Skin is warm. He is diaphoretic.    ED Course  Procedures (including critical care time)  Labs Reviewed - No data to display No results found.   No diagnosis found.  1045 The patient reports feeling a little better still complains of headache.  The patient has eaten a meal. The patient feels improved at this time. The patient is advised that this is most likely a viral type illness. He is advised to return here for any worsening in his condition.  MDM  MDM Reviewed: vitals, nursing note and previous chart Interpretation: labs and x-ray            Carlyle Dolly, PA-C 07/06/12 1210

## 2012-07-06 NOTE — ED Notes (Signed)
Pt reports back, neck, eye, head and chest pain x 10 days.  Pt very tearful, reports that this is the worst that he has ever felt.  Reports he feels like he has a head cold.  Pt very vague, multiple complaints.

## 2012-07-06 NOTE — ED Provider Notes (Signed)
Medical screening examination/treatment/procedure(s) were performed by non-physician practitioner and as supervising physician I was immediately available for consultation/collaboration.  Gerhard Munch, MD 07/06/12 1650

## 2012-11-14 ENCOUNTER — Encounter (HOSPITAL_COMMUNITY): Payer: Self-pay | Admitting: Emergency Medicine

## 2012-11-14 ENCOUNTER — Emergency Department (INDEPENDENT_AMBULATORY_CARE_PROVIDER_SITE_OTHER)
Admission: EM | Admit: 2012-11-14 | Discharge: 2012-11-14 | Disposition: A | Discharge status: Home / Self Care | Payer: Managed Care, Other (non HMO) | Source: Home / Self Care | Attending: Family Medicine | Admitting: Family Medicine

## 2012-11-14 ENCOUNTER — Emergency Department (INDEPENDENT_AMBULATORY_CARE_PROVIDER_SITE_OTHER): Payer: Managed Care, Other (non HMO)

## 2012-11-14 DIAGNOSIS — S62309A Unspecified fracture of unspecified metacarpal bone, initial encounter for closed fracture: Secondary | ICD-10-CM

## 2012-11-14 DIAGNOSIS — S62306A Unspecified fracture of fifth metacarpal bone, right hand, initial encounter for closed fracture: Secondary | ICD-10-CM

## 2012-11-14 MED ORDER — IBUPROFEN 800 MG PO TABS
ORAL_TABLET | ORAL | Status: AC
  Filled 2012-11-14: qty 1

## 2012-11-14 MED ORDER — IBUPROFEN 600 MG PO TABS: 600.0000 mg | ORAL_TABLET | Freq: Four times a day (QID) | ORAL | Status: DC | PRN

## 2012-11-14 MED ORDER — IBUPROFEN 800 MG PO TABS
800.0000 mg | ORAL_TABLET | Freq: Once | ORAL | Status: AC
  Administered 2012-11-14: 800 mg via ORAL
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
## 2012-11-14 NOTE — ED Notes (Signed)
Pt c/o right hand injury last night hit his hand on the doorway. Has numbness in pinky finger. Has swelling, used ice pack with mild relief. Patient is alert and oriented.

## 2012-11-16 NOTE — ED Provider Notes (Signed)
Medical screening examination/treatment/procedure(s) were performed by resident physician or non-physician practitioner and as supervising physician I was immediately available for consultation/collaboration.   KINDL,JAMES DOUGLAS MD.   James D Kindl, MD 11/16/12 2020 

## 2013-02-24 ENCOUNTER — Emergency Department (INDEPENDENT_AMBULATORY_CARE_PROVIDER_SITE_OTHER)
Admission: EM | Admit: 2013-02-24 | Discharge: 2013-02-24 | Disposition: A | Discharge status: Home / Self Care | Payer: Managed Care, Other (non HMO) | Source: Home / Self Care | Attending: Emergency Medicine | Admitting: Emergency Medicine

## 2013-02-24 ENCOUNTER — Other Ambulatory Visit (HOSPITAL_COMMUNITY)
Admission: RE | Admit: 2013-02-24 | Discharge: 2013-02-24 | Disposition: A | Discharge status: Home / Self Care | Payer: Managed Care, Other (non HMO) | Source: Ambulatory Visit | Attending: Emergency Medicine | Admitting: Emergency Medicine

## 2013-02-24 ENCOUNTER — Encounter (HOSPITAL_COMMUNITY): Payer: Self-pay | Admitting: *Deleted

## 2013-02-24 DIAGNOSIS — R300 Dysuria: Secondary | ICD-10-CM

## 2013-02-24 DIAGNOSIS — J029 Acute pharyngitis, unspecified: Secondary | ICD-10-CM

## 2013-02-24 DIAGNOSIS — R05 Cough: Secondary | ICD-10-CM

## 2013-02-24 DIAGNOSIS — Z113 Encounter for screening for infections with a predominantly sexual mode of transmission: Secondary | ICD-10-CM | POA: Insufficient documentation

## 2013-02-24 LAB — POCT URINALYSIS DIP (DEVICE)
Bilirubin Urine: NEGATIVE
Glucose, UA: NEGATIVE mg/dL
Leukocytes, UA: NEGATIVE
Nitrite: NEGATIVE

## 2013-02-24 MED ORDER — AZITHROMYCIN 250 MG PO TABS
ORAL_TABLET | ORAL | Status: AC
  Filled 2013-02-24: qty 4

## 2013-02-24 MED ORDER — NAPROXEN 500 MG PO TABS: 500.0000 mg | ORAL_TABLET | Freq: Two times a day (BID) | ORAL | Status: DC

## 2013-02-24 MED ORDER — CEFTRIAXONE SODIUM 250 MG IJ SOLR
250.0000 mg | Freq: Once | INTRAMUSCULAR | Status: AC
  Administered 2013-02-24: 250 mg via INTRAMUSCULAR

## 2013-02-24 MED ORDER — HYDROCOD POLST-CHLORPHEN POLST 10-8 MG/5ML PO LQCR: 5.0000 mL | Freq: Two times a day (BID) | ORAL | Status: DC | PRN

## 2013-02-24 MED ORDER — AZITHROMYCIN 250 MG PO TABS
1000.0000 mg | ORAL_TABLET | Freq: Once | ORAL | Status: AC
  Administered 2013-02-24: 1000 mg via ORAL

## 2013-02-24 MED ORDER — PHENAZOPYRIDINE HCL 200 MG PO TABS: 200.0000 mg | ORAL_TABLET | Freq: Three times a day (TID) | ORAL | Status: DC | PRN

## 2013-02-24 MED ORDER — LIDOCAINE HCL (PF) 1 % IJ SOLN
INTRAMUSCULAR | Status: AC
  Filled 2013-02-24: qty 5

## 2013-02-24 MED ORDER — CEFTRIAXONE SODIUM 250 MG IJ SOLR
INTRAMUSCULAR | Status: AC
  Filled 2013-02-24: qty 250

## 2013-02-24 NOTE — ED Provider Notes (Signed)
Chief Complaint:   Chief Complaint  Patient presents with  . Dysuria  . Cough    History of Present Illness:   Timothy Harrington is a 47 year old male who was in today because of concerns about STDs. The past week he's had some burning with urination but no discharge. His lymph nodes in his groin seemed a little bit sore. He denies any genital lesions, ulcers, sores, or blisters. He also has had a one-week history of a cough productive of small amounts of clear sputum and a sore throat. He denies any fever, chills, joint pain, or skin rash. No prior history of STDs. He has one partner. She has had some suprapubic pain, but no other symptoms.   Review of Systems:  Other than noted above, the patient denies any of the following symptoms: Systemic:  No fevers chills, aches, weight loss, arthralgias, myalgias, or adenopathy. GI:  No abdominal pain, nausea or vomiting. GU:  No dysuria, penile pain, discharge, itching, dysuria, genital lesions, testicular pain or swelling. Skin:  No rash or itching.  PMFSH:  Past medical history, family history, social history, meds, and allergies were reviewed.  Physical Exam:   Vital signs:  BP 129/83  Pulse 77  Temp(Src) 98.8 F (37.1 C) (Oral)  Resp 16  SpO2 97% Gen:  Alert, oriented, in no distress. ENT: Pharynx slightly red without exudate, no cervical adenopathy. Lungs: Clear to auscultation. Abdomen:  Soft and flat, non-distended, and non-tender.  No organomegaly or mass. Genital:  No urethral discharge, no penile lesions, testes normal, no inguinal adenopathy. Skin:  Warm and dry.  No rash.   Labs:   Results for orders placed during the hospital encounter of 02/24/13  POCT URINALYSIS DIP (DEVICE)      Result Value Range   Glucose, UA NEGATIVE  NEGATIVE mg/dL   Bilirubin Urine NEGATIVE  NEGATIVE   Ketones, ur NEGATIVE  NEGATIVE mg/dL   Specific Gravity, Urine 1.025  1.005 - 1.030   Hgb urine dipstick TRACE (*) NEGATIVE   pH 6.5  5.0 - 8.0   Protein, ur NEGATIVE  NEGATIVE mg/dL   Urobilinogen, UA 0.2  0.0 - 1.0 mg/dL   Nitrite NEGATIVE  NEGATIVE   Leukocytes, UA NEGATIVE  NEGATIVE    DNA probe for gonorrhea, Chlamydia, Trichomonas obtained. Also obtain serology for HIV and syphilis. A urine culture was also sent. A throat culture for gonococcus was also sent.  Course in Urgent Care Center:  Given Rocephin 250 mg IM and azithromycin 1000 mg by mouth.  Assessment:  The primary encounter diagnosis was Dysuria. Diagnoses of Cough and Pharyngitis were also pertinent to this visit.  This may or may not be an STD. I told him equally as likely would be UTI or upper respiratory infection. I encouraged him to quit smoking.  Plan:   1.  The following meds were prescribed:   New Prescriptions   CHLORPHENIRAMINE-HYDROCODONE (TUSSIONEX) 10-8 MG/5ML LQCR    Take 5 mLs by mouth every 12 (twelve) hours as needed.   NAPROXEN (NAPROSYN) 500 MG TABLET    Take 1 tablet (500 mg total) by mouth 2 (two) times daily.   PHENAZOPYRIDINE (PYRIDIUM) 200 MG TABLET    Take 1 tablet (200 mg total) by mouth 3 (three) times daily as needed for pain.   2.  The patient was instructed in symptomatic care and handouts were given. 3.  The patient was told to return if becoming worse in any way, if no better in 3  or 4 days, and given some red flag symptoms such as difficulty urinating, blood in urine, or fever that would indicate earlier return. 4.  The patient was instructed to inform all sexual contacts, avoid intercourse completely for 2 weeks and then only with a condom.  The patient was told that we would call about all abnormal lab results, and that we would need to report certain kinds of infection to the health department. 5.  Follow up here if necessary.    Reuben Likes, MD 02/24/13 [phone]

## 2013-02-24 NOTE — ED Notes (Signed)
C/O dysuria and penile pain without discharge.  Partner is being seen also, but has no sxs.  Also c/o sore throat and slightly productive cough.  Denies nasal congestion or fevers.

## 2013-02-25 LAB — RPR: RPR Ser Ql: NONREACTIVE

## 2013-02-26 LAB — URINE CULTURE: Special Requests: NORMAL

## 2013-02-27 LAB — GONOCOCCUS CULTURE

## 2013-03-15 ENCOUNTER — Ambulatory Visit: Payer: Managed Care, Other (non HMO)

## 2013-03-15 ENCOUNTER — Ambulatory Visit (INDEPENDENT_AMBULATORY_CARE_PROVIDER_SITE_OTHER): Payer: Managed Care, Other (non HMO) | Admitting: Internal Medicine

## 2013-03-15 VITALS — BP 138/82 | HR 76 | Temp 98.9°F | Resp 18 | Ht 69.75 in | Wt 185.2 lb

## 2013-03-15 DIAGNOSIS — R059 Cough, unspecified: Secondary | ICD-10-CM

## 2013-03-15 DIAGNOSIS — R05 Cough: Secondary | ICD-10-CM

## 2013-03-15 DIAGNOSIS — R062 Wheezing: Secondary | ICD-10-CM

## 2013-03-15 LAB — POCT CBC
Granulocyte percent: 65.4 %G (ref 37–80)
HCT, POC: 49.5 % (ref 43.5–53.7)
Hemoglobin: 16.4 g/dL (ref 14.1–18.1)
Lymph, poc: 2.1 (ref 0.6–3.4)
MCH, POC: 30 pg (ref 27–31.2)
MCHC: 33.1 g/dL (ref 31.8–35.4)
MCV: 90.5 fL (ref 80–97)
MID (cbc): 0.5 (ref 0–0.9)
MPV: 9.6 fL (ref 0–99.8)
POC Granulocyte: 4.8 (ref 2–6.9)
POC LYMPH PERCENT: 27.9 %L (ref 10–50)
POC MID %: 6.7 %M (ref 0–12)
Platelet Count, POC: 175 10*3/uL (ref 142–424)
RBC: 5.47 M/uL (ref 4.69–6.13)
RDW, POC: 12.9 %
WBC: 7.4 10*3/uL (ref 4.6–10.2)

## 2013-03-15 MED ORDER — AZITHROMYCIN 250 MG PO TABS: ORAL_TABLET | ORAL | Status: DC

## 2013-03-15 MED ORDER — ALBUTEROL SULFATE (2.5 MG/3ML) 0.083% IN NEBU
2.5000 mg | INHALATION_SOLUTION | Freq: Once | RESPIRATORY_TRACT | Status: AC
  Administered 2013-03-15: 2.5 mg via RESPIRATORY_TRACT

## 2013-03-15 MED ORDER — HYDROCODONE-HOMATROPINE 5-1.5 MG/5ML PO SYRP: 5.0000 mL | ORAL_SOLUTION | Freq: Three times a day (TID) | ORAL | Status: DC | PRN

## 2013-03-15 MED ORDER — IPRATROPIUM BROMIDE 0.02 % IN SOLN
0.5000 mg | Freq: Once | RESPIRATORY_TRACT | Status: AC
  Administered 2013-03-15: 0.5 mg via RESPIRATORY_TRACT

## 2013-03-15 MED ORDER — PREDNISONE 20 MG PO TABS: ORAL_TABLET | ORAL | Status: DC

## 2013-03-15 NOTE — Progress Notes (Signed)
  Subjective:    Patient ID: Timothy Harrington, male    DOB: [birth year], 47 y.o.   MRN: [id number]  HPI 47 year old male presents with 1 week of progressively worsening cough, chest tightness, and wheezing.  Admits to some PND but denies nasal congestion, sinus pain, otalgia, headache, dizziness, nausea, or vomiting. No hempotysis but has had some cough productive of green sputum.  Complains of subjective fever and chills.  He has been taking Mucinex which does seem to help.  No hx of asthma but has noticed wheezing and SOB.   Patient is otherwise healthy with no known medical problems or hx of DM.     Review of Systems  Constitutional: Positive for fever (subjective) and chills.  HENT: Positive for sore throat and postnasal drip. Negative for ear pain, congestion and rhinorrhea.   Respiratory: Positive for cough, shortness of breath and wheezing. Negative for chest tightness.   Gastrointestinal: Negative for nausea, vomiting and abdominal pain.  Neurological: Negative for dizziness and headaches.       Objective:   Physical Exam  Constitutional: He is oriented to person, place, and time. He appears well-developed and well-nourished.  HENT:  Head: Normocephalic and atraumatic.  Right Ear: Hearing, tympanic membrane, external ear and ear canal normal.  Left Ear: Hearing, tympanic membrane, external ear and ear canal normal.  Mouth/Throat: Uvula is midline, oropharynx is clear and moist and mucous membranes are normal. No oropharyngeal exudate.  Eyes: Conjunctivae are normal.  Neck: Normal range of motion. Neck supple.  Cardiovascular: Normal rate, regular rhythm and normal heart sounds.   Pulmonary/Chest: Effort normal. He has wheezes.  Lymphadenopathy:    He has no cervical adenopathy.  Neurological: He is alert and oriented to person, place, and time.  Psychiatric: He has a normal mood and affect. His behavior is normal. Judgment and thought content normal.    Patient reports  improvement in symptoms s/p albuterol/atrovent nebulizer.  Wheezing also improved.   [facility] reading (PRIMARY) by  Dr. Merla Riches as increased markings RLL.      Assessment & Plan:  Cough - Plan: POCT CBC, DG Chest 2 View  Wheezing - Plan: albuterol (PROVENTIL) (2.5 MG/3ML) 0.083% nebulizer solution 2.5 mg, ipratropium (ATROVENT) nebulizer solution 0.5 mg  Will cover with zpack as directed Prednisone taper Hycodan qhs prn cough Increase fluids and rest Follow up if symptoms worsen or fail to improve.   I have reviewed and agree with documentation. Robert P. Merla Riches, M.D.

## 2013-03-15 NOTE — Patient Instructions (Signed)
Start Zpack today Take prednisone taper as directed Increase fluids and rest Tylenol as needed for fever/chills Hycodan cough syrup at bedtime as needed for cough Follow up if symptoms worsen or fail to improve.

## 2014-09-11 ENCOUNTER — Emergency Department (INDEPENDENT_AMBULATORY_CARE_PROVIDER_SITE_OTHER)
Admission: EM | Admit: 2014-09-11 | Discharge: 2014-09-11 | Disposition: A | Discharge status: Home / Self Care | Payer: Managed Care, Other (non HMO) | Source: Home / Self Care | Attending: Family Medicine | Admitting: Family Medicine

## 2014-09-11 ENCOUNTER — Encounter (HOSPITAL_COMMUNITY): Payer: Self-pay | Admitting: Emergency Medicine

## 2014-09-11 ENCOUNTER — Emergency Department (HOSPITAL_COMMUNITY)
Admission: EM | Admit: 2014-09-11 | Discharge: 2014-09-11 | Disposition: A | Discharge status: Home / Self Care | Payer: Self-pay | Source: Home / Self Care

## 2014-09-11 ENCOUNTER — Emergency Department (HOSPITAL_COMMUNITY): Payer: Managed Care, Other (non HMO)

## 2014-09-11 ENCOUNTER — Emergency Department (INDEPENDENT_AMBULATORY_CARE_PROVIDER_SITE_OTHER): Payer: Managed Care, Other (non HMO)

## 2014-09-11 DIAGNOSIS — S99921A Unspecified injury of right foot, initial encounter: Secondary | ICD-10-CM

## 2014-09-11 HISTORY — DX: Calculus of kidney: N20.0
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
## 2014-09-11 NOTE — ED Notes (Signed)
Reports slipping on bathroom floor last night injuring right foot.  Having pain in the fourth and fifth toes.   States pain is also on medial and lateral sides of the right foot.  Pt has used ice for comfort.

## 2014-09-11 NOTE — Discharge Instructions (Signed)
You have bruised or fractured her foot. These wear the postop shoe every day for at least the next 1-2 weeks. If your foot is fractured I will call you with the results and have you follow-up with the necessary physician. Please use ibuprofen 600 mg every 6 hours for pain and swelling. Please ischial foot as necessary for the next 24 hours.

## 2014-09-15 NOTE — ED Notes (Signed)
Patient called to verify MD referral # ,name

## 2015-01-03 ENCOUNTER — Encounter (HOSPITAL_COMMUNITY): Payer: Self-pay

## 2015-01-03 ENCOUNTER — Emergency Department (INDEPENDENT_AMBULATORY_CARE_PROVIDER_SITE_OTHER)
Admission: EM | Admit: 2015-01-03 | Discharge: 2015-01-03 | Disposition: A | Discharge status: Home / Self Care | Payer: Managed Care, Other (non HMO) | Source: Home / Self Care | Attending: Family Medicine | Admitting: Family Medicine

## 2015-01-03 DIAGNOSIS — T700XXA Otitic barotrauma, initial encounter: Secondary | ICD-10-CM | POA: Diagnosis not present

## 2015-01-03 MED ORDER — PREDNISONE 10 MG PO TABS: 30.0000 mg | ORAL_TABLET | Freq: Every day | ORAL | Status: DC

## 2015-01-03 NOTE — ED Notes (Signed)
Bilateral ear pain and stopped up with cough ( prod) x 2 weeks

## 2015-01-03 NOTE — ED Provider Notes (Signed)
AMEY OLDEN is a 49 y.o. male who presents to Urgent Care today for left ear pain and pressure for 2 days. It feels congested. He's tried peroxide which helped a bit. No fevers or chills nausea vomiting or diarrhea. He feels well otherwise.   Past Medical History  Diagnosis Date  . Kidney stones    Past Surgical History  Procedure Laterality Date  . Kidney stone surgery     History  Substance Use Topics  . Smoking status: Current Every Day Smoker -- 0.25 packs/day    Types: Cigarettes  . Smokeless tobacco: Not on file  . Alcohol Use: Yes     Comment: socially   ROS as above Medications: No current facility-administered medications for this encounter.   Current Outpatient Prescriptions  Medication Sig Dispense Refill  . predniSONE (DELTASONE) 10 MG tablet Take 3 tablets (30 mg total) by mouth daily. 15 tablet 0   No Known Allergies   Exam:  BP 115/70 mmHg  Pulse 73  Temp(Src) 98.8 F (37.1 C) (Oral)  Resp 16  SpO2 98% Gen: Well NAD HEENT: EOMI,  MMM left tympanic membrane with effusion without erythema. Right is normal. Mastoids are nontender. Lungs: Normal work of breathing. CTABL Heart: RRR no MRG Abd: NABS, Soft. Nondistended, Nontender Exts: Brisk capillary refill, warm and well perfused.   No results found for this or any previous visit (from the past 24 hour(s)). No results found.  Assessment and Plan: 49 y.o. male with serous otitis media or barotitis media. Treat with prednisone. Return as needed.  Discussed warning signs or symptoms. Please see discharge instructions. Patient expresses understanding.     Rodolph Bong, MD 01/03/15 [phone]

## 2015-01-03 NOTE — Discharge Instructions (Signed)
Thank you for coming in today. ° ° ° °Barotitis Media °Barotitis media is inflammation of your middle ear. This occurs when the auditory tube (eustachian tube) leading from the back of your nose (nasopharynx) to your eardrum is blocked. This blockage may result from a cold, environmental allergies, or an upper respiratory infection. Unresolved barotitis media may lead to damage or hearing loss (barotrauma), which may become permanent. °HOME CARE INSTRUCTIONS  °· Use medicines as recommended by your health care provider. Over-the-counter medicines will help unblock the canal and can help during times of air travel. °· Do not put anything into your ears to clean or unplug them. Eardrops will not be helpful. °· Do not swim, dive, or fly until your health care provider says it is all right to do so. If these activities are necessary, chewing gum with frequent, forceful swallowing may help. It is also helpful to hold your nose and gently blow to pop your ears for equalizing pressure changes. This forces air into the eustachian tube. °· Only take over-the-counter or prescription medicines for pain, discomfort, or fever as directed by your health care provider. °· A decongestant may be helpful in decongesting the middle ear and make pressure equalization easier. °SEEK MEDICAL CARE IF: °· You experience a serious form of dizziness in which you feel as if the room is spinning and you feel nauseated (vertigo). °· Your symptoms only involve one ear. °SEEK IMMEDIATE MEDICAL CARE IF:  °· You develop a severe headache, dizziness, or severe ear pain. °· You have bloody or pus-like drainage from your ears. °· You develop a fever. °· Your problems do not improve or become worse. °MAKE SURE YOU:  °· Understand these instructions. °· Will watch your condition. °· Will get help right away if you are not doing well or get worse. °Document Released: 07/08/2000 Document Revised: 05/01/2013 Document Reviewed: 02/05/2013 °[employer]® Patient  Information ©2015 [employer]. This information is not intended to replace advice given to you by your health care provider. Make sure you discuss any questions you have with your health care provider. ° °

## 2015-01-23 ENCOUNTER — Encounter (HOSPITAL_COMMUNITY): Payer: Self-pay | Admitting: Emergency Medicine

## 2015-01-23 ENCOUNTER — Other Ambulatory Visit (HOSPITAL_COMMUNITY)
Admission: RE | Admit: 2015-01-23 | Discharge: 2015-01-23 | Disposition: A | Discharge status: Home / Self Care | Payer: Managed Care, Other (non HMO) | Source: Ambulatory Visit | Attending: Internal Medicine | Admitting: Internal Medicine

## 2015-01-23 ENCOUNTER — Emergency Department (INDEPENDENT_AMBULATORY_CARE_PROVIDER_SITE_OTHER)
Admission: EM | Admit: 2015-01-23 | Discharge: 2015-01-23 | Disposition: A | Discharge status: Home / Self Care | Payer: Managed Care, Other (non HMO) | Source: Home / Self Care | Attending: Internal Medicine | Admitting: Internal Medicine

## 2015-01-23 DIAGNOSIS — R300 Dysuria: Secondary | ICD-10-CM

## 2015-01-23 DIAGNOSIS — N342 Other urethritis: Secondary | ICD-10-CM

## 2015-01-23 DIAGNOSIS — Z113 Encounter for screening for infections with a predominantly sexual mode of transmission: Secondary | ICD-10-CM | POA: Insufficient documentation

## 2015-01-23 LAB — POCT URINALYSIS DIP (DEVICE)
BILIRUBIN URINE: NEGATIVE
Glucose, UA: NEGATIVE mg/dL
HGB URINE DIPSTICK: NEGATIVE
KETONES UR: NEGATIVE mg/dL
Leukocytes, UA: NEGATIVE
Nitrite: NEGATIVE
PROTEIN: NEGATIVE mg/dL
SPECIFIC GRAVITY, URINE: 1.025 (ref 1.005–1.030)
UROBILINOGEN UA: 0.2 mg/dL (ref 0.0–1.0)
pH: 6 (ref 5.0–8.0)

## 2015-01-23 MED ORDER — AZITHROMYCIN 250 MG PO TABS
1000.0000 mg | ORAL_TABLET | Freq: Once | ORAL | Status: AC
  Administered 2015-01-23: 1000 mg via ORAL

## 2015-01-23 MED ORDER — AZITHROMYCIN 250 MG PO TABS
ORAL_TABLET | ORAL | Status: AC
  Filled 2015-01-23: qty 4

## 2015-01-23 MED ORDER — LIDOCAINE HCL (PF) 1 % IJ SOLN
INTRAMUSCULAR | Status: AC
  Filled 2015-01-23: qty 5

## 2015-01-23 MED ORDER — CEFTRIAXONE SODIUM 250 MG IJ SOLR
INTRAMUSCULAR | Status: AC
  Filled 2015-01-23: qty 250

## 2015-01-23 MED ORDER — CEFTRIAXONE SODIUM 250 MG IJ SOLR
250.0000 mg | Freq: Once | INTRAMUSCULAR | Status: AC
  Administered 2015-01-23: 250 mg via INTRAMUSCULAR

## 2015-01-23 NOTE — ED Notes (Signed)
C/o UTI sx onset 2 days Sx include dysuria Denies fevers, chills, abd pain, hematuria, penile d/c Alert, no signs of acute distress.

## 2015-01-23 NOTE — Discharge Instructions (Signed)
Dysuria °Dysuria is the medical term for pain with urination. There are many causes for dysuria, but urinary tract infection is the most common. If a urinalysis was performed it can show that there is a urinary tract infection. A urine culture confirms that you or your child is sick. You will need to follow up with a healthcare provider because: °· If a urine culture was done you will need to know the culture results and treatment recommendations. °· If the urine culture was positive, you or your child will need to be put on antibiotics or know if the antibiotics prescribed are the right antibiotics for your urinary tract infection. °· If the urine culture is negative (no urinary tract infection), then other causes may need to be explored or antibiotics need to be stopped. °Today laboratory work may have been done and there does not seem to be an infection. If cultures were done they will take at least 24 to 48 hours to be completed. °Today x-rays may have been taken and they read as normal. No cause can be found for the problems. The x-rays may be re-read by a radiologist and you will be contacted if additional findings are made. °You or your child may have been put on medications to help with this problem until you can see your primary caregiver. If the problems get better, see your primary caregiver if the problems return. If you were given antibiotics (medications which kill germs), take all of the mediations as directed for the full course of treatment.  °If laboratory work was done, you need to find the results. Leave a telephone number where you can be reached. If this is not possible, make sure you find out how you are to get test results. °HOME CARE INSTRUCTIONS  °· Drink lots of fluids. For adults, drink eight, 8 ounce glasses of clear juice or water a day. For children, replace fluids as suggested by your caregiver. °· Empty the bladder often. Avoid holding urine for long periods of time. °· After a bowel  movement, women should cleanse front to back, using each tissue only once. °· Empty your bladder before and after sexual intercourse. °· Take all the medicine given to you until it is gone. You may feel better in a few days, but TAKE ALL MEDICINE. °· Avoid caffeine, tea, alcohol and carbonated beverages, because they tend to irritate the bladder. °· In men, alcohol may irritate the prostate. °· Only take over-the-counter or prescription medicines for pain, discomfort, or fever as directed by your caregiver. °· If your caregiver has given you a follow-up appointment, it is very important to keep that appointment. Not keeping the appointment could result in a chronic or permanent injury, pain, and disability. If there is any problem keeping the appointment, you must call back to this facility for assistance. °SEEK IMMEDIATE MEDICAL CARE IF:  °· Back pain develops. °· A fever develops. °· There is nausea (feeling sick to your stomach) or vomiting (throwing up). °· Problems are no better with medications or are getting worse. °MAKE SURE YOU:  °· Understand these instructions. °· Will watch your condition. °· Will get help right away if you are not doing well or get worse. °Document Released: 04/08/2004 Document Revised: 10/03/2011 Document Reviewed: 02/14/2008 °[employer]® Patient Information ©2015 [employer]. This information is not intended to replace advice given to you by your health care provider. Make sure you discuss any questions you have with your health care provider. ° ° ° °Urethritis °  Urethritis is an inflammation of the tube through which urine exits your bladder (urethra).  °CAUSES °Urethritis is often caused by an infection in your urethra. The infection can be viral, like herpes. The infection can also be bacterial, like gonorrhea. °RISK FACTORS °Risk factors of urethritis include: °· Having sex without using a condom. °· Having multiple sexual partners. °· Having poor hygiene. °SIGNS AND  SYMPTOMS °Symptoms of urethritis are less noticeable in women than in men. These symptoms include: °· Burning feeling when you urinate (dysuria). °· Discharge from your urethra. °· Blood in your urine (hematuria). °· Urinating more than usual. °DIAGNOSIS  °To confirm a diagnosis of urethritis, your health care provider will do the following: °· Ask about your sexual history. °· Perform a physical exam. °· Have you provide a sample of your urine for lab testing. °· Use a cotton swab to gently collect a sample from your urethra for lab testing. °TREATMENT  °It is important to treat urethritis. Depending on the cause, untreated urethritis may lead to serious genital infections and possibly infertility. Urethritis caused by a bacterial infection is treated with antibiotic medicine. All sexual partners must be treated.  °HOME CARE INSTRUCTIONS °· Do not have sex until the test results are known and treatment is completed, even if your symptoms go away before you finish treatment. °· If you were prescribed an antibiotic, finish it all even if you start to feel better. °SEEK MEDICAL CARE IF:  °· Your symptoms are not improved in 3 days. °· Your symptoms are getting worse. °· You develop abdominal pain or pelvic pain (in women). °· You develop joint pain. °· You have a fever. °SEEK IMMEDIATE MEDICAL CARE IF:  °· You have severe pain in the belly, back, or side. °· You have repeated vomiting. °MAKE SURE YOU: °· Understand these instructions. °· Will watch your condition. °· Will get help right away if you are not doing well or get worse. °Document Released: 01/04/2001 Document Revised: 11/25/2013 Document Reviewed: 03/11/2013 °[employer]® Patient Information ©2015 [employer]. This information is not intended to replace advice given to you by your health care provider. Make sure you discuss any questions you have with your health care provider. ° °

## 2015-01-23 NOTE — ED Provider Notes (Signed)
CSN: [id number]     Arrival date & time 01/23/15  1302 History   First MD Initiated Contact with Patient 01/23/15 1329     Chief Complaint  Patient presents with  . Urinary Tract Infection   (Consider location/radiation/quality/duration/timing/severity/associated sxs/prior Treatment) HPI Comments: 49 year old male complaining of dysuria for 2 days. Denies penile discharge. Denies urinary frequency or urgency. Denies pain in the pelvis. Denies pain in the genitalia. Denies back pain.   Past Medical History  Diagnosis Date  . Kidney stones    Past Surgical History  Procedure Laterality Date  . Kidney stone surgery     Family History  Problem Relation Age of Onset  . Cancer Neg Hx   . Hypertension Neg Hx    History  Substance Use Topics  . Smoking status: Current Every Day Smoker -- 0.25 packs/day    Types: Cigarettes  . Smokeless tobacco: Not on file  . Alcohol Use: Yes     Comment: socially    Review of Systems  Constitutional: Negative.   Respiratory: Negative.   Gastrointestinal: Negative.   Genitourinary: Positive for dysuria. Negative for urgency, frequency, hematuria, flank pain, decreased urine volume, discharge, penile swelling, scrotal swelling, genital sores, penile pain and testicular pain.  Skin: Negative.   Neurological: Negative.     Allergies  Review of patient's allergies indicates no known allergies.  Home Medications   Prior to Admission medications   Medication Sig Start Date End Date Taking? Authorizing Provider  predniSONE (DELTASONE) 10 MG tablet Take 3 tablets (30 mg total) by mouth daily. 01/03/15   Rodolph BongEvan S Corey, MD   BP 148/87 mmHg  Pulse 93  Temp(Src) 99.3 F (37.4 C) (Oral)  Resp 18  SpO2 97% Physical Exam  Constitutional: He is oriented to person, place, and time. He appears well-developed and well-nourished. No distress.  Neck: Normal range of motion. Neck supple.  Cardiovascular: Normal rate.   Pulmonary/Chest: Effort normal. No  respiratory distress.  Genitourinary: No penile tenderness.  Musculoskeletal: He exhibits no edema.  Neurological: He is alert and oriented to person, place, and time.  Skin: Skin is warm and dry.  Nursing note and vitals reviewed.   ED Course  Procedures (including critical care time) Labs Review Labs Reviewed  POCT URINALYSIS DIP (DEVICE)  URINE CYTOLOGY ANCILLARY ONLY   Results for orders placed or performed during the hospital encounter of 01/23/15  POCT urinalysis dip (device)  Result Value Ref Range   Glucose, UA NEGATIVE NEGATIVE mg/dL   Bilirubin Urine NEGATIVE NEGATIVE   Ketones, ur NEGATIVE NEGATIVE mg/dL   Specific Gravity, Urine 1.025 1.005 - 1.030   Hgb urine dipstick NEGATIVE NEGATIVE   pH 6.0 5.0 - 8.0   Protein, ur NEGATIVE NEGATIVE mg/dL   Urobilinogen, UA 0.2 0.0 - 1.0 mg/dL   Nitrite NEGATIVE NEGATIVE   Leukocytes, UA NEGATIVE NEGATIVE   Imaging Review No results found.   MDM   1. Urethritis   2. Dysuria    Urine cytology pending. Azithromycin 1 g by mouth Rocephin 2050 mg IM   Hayden Rasmussenavid Farran Amsden, NP 01/23/15 1406

## 2015-01-23 NOTE — ED Notes (Signed)
Call back number verified.  

## 2015-01-27 LAB — URINE CYTOLOGY ANCILLARY ONLY
CHLAMYDIA, DNA PROBE: NEGATIVE
Neisseria Gonorrhea: NEGATIVE
Trichomonas: NEGATIVE

## 2015-01-27 NOTE — ED Notes (Signed)
Final report of GC chlamydia , trichomonas negative

## 2015-10-09 ENCOUNTER — Emergency Department (HOSPITAL_COMMUNITY)
Admission: EM | Admit: 2015-10-09 | Discharge: 2015-10-09 | Disposition: A | Discharge status: Home / Self Care | Payer: Managed Care, Other (non HMO) | Attending: Emergency Medicine | Admitting: Emergency Medicine

## 2015-10-09 ENCOUNTER — Encounter (HOSPITAL_COMMUNITY): Payer: Self-pay

## 2015-10-09 DIAGNOSIS — Y998 Other external cause status: Secondary | ICD-10-CM | POA: Insufficient documentation

## 2015-10-09 DIAGNOSIS — F17210 Nicotine dependence, cigarettes, uncomplicated: Secondary | ICD-10-CM | POA: Insufficient documentation

## 2015-10-09 DIAGNOSIS — S0990XA Unspecified injury of head, initial encounter: Secondary | ICD-10-CM | POA: Insufficient documentation

## 2015-10-09 DIAGNOSIS — S161XXA Strain of muscle, fascia and tendon at neck level, initial encounter: Secondary | ICD-10-CM | POA: Diagnosis not present

## 2015-10-09 DIAGNOSIS — Y9389 Activity, other specified: Secondary | ICD-10-CM | POA: Insufficient documentation

## 2015-10-09 DIAGNOSIS — Z87442 Personal history of urinary calculi: Secondary | ICD-10-CM | POA: Insufficient documentation

## 2015-10-09 DIAGNOSIS — Z7952 Long term (current) use of systemic steroids: Secondary | ICD-10-CM | POA: Insufficient documentation

## 2015-10-09 DIAGNOSIS — S199XXA Unspecified injury of neck, initial encounter: Secondary | ICD-10-CM | POA: Diagnosis present

## 2015-10-09 DIAGNOSIS — Y92410 Unspecified street and highway as the place of occurrence of the external cause: Secondary | ICD-10-CM | POA: Diagnosis not present

## 2015-10-09 MED ORDER — IBUPROFEN 600 MG PO TABS: 600.0000 mg | ORAL_TABLET | Freq: Four times a day (QID) | ORAL | Status: DC | PRN

## 2015-10-09 MED ORDER — METHOCARBAMOL 500 MG PO TABS: 500.0000 mg | ORAL_TABLET | Freq: Two times a day (BID) | ORAL | Status: DC
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
## 2015-10-09 NOTE — ED Notes (Addendum)
Pt evaluated by edp at triage.

## 2015-10-09 NOTE — Discharge Instructions (Signed)
°Cervical Strain and Sprain With Rehab °Cervical strain and sprain are injuries that commonly occur with "whiplash" injuries. Whiplash occurs when the neck is forcefully whipped backward or forward, such as during a motor vehicle accident or during contact sports. The muscles, ligaments, tendons, discs, and nerves of the neck are susceptible to injury when this occurs. °RISK FACTORS °Risk of having a whiplash injury increases if: °· Osteoarthritis of the spine. °· Situations that make head or neck accidents or trauma more likely. °· High-risk sports (football, rugby, wrestling, hockey, auto racing, gymnastics, diving, contact karate, or boxing). °· Poor strength and flexibility of the neck. °· Previous neck injury. °· Poor tackling technique. °· Improperly fitted or padded equipment. °SYMPTOMS  °· Pain or stiffness in the front or back of neck or both. °· Symptoms may present immediately or up to 24 hours after injury. °· Dizziness, headache, nausea, and vomiting. °· Muscle spasm with soreness and stiffness in the neck. °· Tenderness and swelling at the injury site. °PREVENTION °· Learn and use proper technique (avoid tackling with the head, spearing, and head-butting; use proper falling techniques to avoid landing on the head). °· Warm up and stretch properly before activity. °· Maintain physical fitness: °¨ Strength, flexibility, and endurance. °¨ Cardiovascular fitness. °· Wear properly fitted and padded protective equipment, such as padded soft collars, for participation in contact sports. °PROGNOSIS  °Recovery from cervical strain and sprain injuries is dependent on the extent of the injury. These injuries are usually curable in 1 week to 3 months with appropriate treatment.  °RELATED COMPLICATIONS  °· Temporary numbness and weakness may occur if the nerve roots are damaged, and this may persist until the nerve has completely healed. °· Chronic pain due to frequent recurrence of symptoms. °· Prolonged healing,  especially if activity is resumed too soon (before complete recovery). °TREATMENT  °Treatment initially involves the use of ice and medication to help reduce pain and inflammation. It is also important to perform strengthening and stretching exercises and modify activities that worsen symptoms so the injury does not get worse. These exercises may be performed at home or with a therapist. For patients who experience severe symptoms, a soft, padded collar may be recommended to be worn around the neck.  °Improving your posture may help reduce symptoms. Posture improvement includes pulling your chin and abdomen in while sitting or standing. If you are sitting, sit in a firm chair with your buttocks against the back of the chair. While sleeping, try replacing your pillow with a small towel rolled to 2 inches in diameter, or use a cervical pillow or soft cervical collar. Poor sleeping positions delay healing.  °For patients with nerve root damage, which causes numbness or weakness, the use of a cervical traction apparatus may be recommended. Surgery is rarely necessary for these injuries. However, cervical strain and sprains that are present at birth (congenital) may require surgery. °MEDICATION  °· If pain medication is necessary, nonsteroidal anti-inflammatory medications, such as aspirin and ibuprofen, or other minor pain relievers, such as acetaminophen, are often recommended. °· Do not take pain medication for 7 days before surgery. °· Prescription pain relievers may be given if deemed necessary by your caregiver. Use only as directed and only as much as you need. °HEAT AND COLD:  °· Cold treatment (icing) relieves pain and reduces inflammation. Cold treatment should be applied for 10 to 15 minutes every 2 to 3 hours for inflammation and pain and immediately after any activity that aggravates your   symptoms. Use ice packs or an ice massage. °· Heat treatment may be used prior to performing the stretching and  strengthening activities prescribed by your caregiver, physical therapist, or athletic trainer. Use a heat pack or a warm soak. °SEEK MEDICAL CARE IF:  °· Symptoms get worse or do not improve in 2 weeks despite treatment. °· New, unexplained symptoms develop (drugs used in treatment may produce side effects). °EXERCISES °RANGE OF MOTION (ROM) AND STRETCHING EXERCISES - Cervical Strain and Sprain °These exercises may help you when beginning to rehabilitate your injury. In order to successfully resolve your symptoms, you must improve your posture. These exercises are designed to help reduce the forward-head and rounded-shoulder posture which contributes to this condition. Your symptoms may resolve with or without further involvement from your physician, physical therapist or athletic trainer. While completing these exercises, remember:  °· Restoring tissue flexibility helps normal motion to return to the joints. This allows healthier, less painful movement and activity. °· An effective stretch should be held for at least 20 seconds, although you may need to begin with shorter hold times for comfort. °· A stretch should never be painful. You should only feel a gentle lengthening or release in the stretched tissue. °STRETCH- Axial Extensors °· Lie on your back on the floor. You may bend your knees for comfort. Place a rolled-up hand towel or dish towel, about 2 inches in diameter, under the part of your head that makes contact with the floor. °· Gently tuck your chin, as if trying to make a "double chin," until you feel a gentle stretch at the base of your head. °· Hold __________ seconds. °Repeat __________ times. Complete this exercise __________ times per day.  °STRETCH - Axial Extension  °· Stand or sit on a firm surface. Assume a good posture: chest up, shoulders drawn back, abdominal muscles slightly tense, knees unlocked (if standing) and feet hip width apart. °· Slowly retract your chin so your head slides back  and your chin slightly lowers. Continue to look straight ahead. °· You should feel a gentle stretch in the back of your head. Be certain not to feel an aggressive stretch since this can cause headaches later. °· Hold for __________ seconds. °Repeat __________ times. Complete this exercise __________ times per day. °STRETCH - Cervical Side Bend  °· Stand or sit on a firm surface. Assume a good posture: chest up, shoulders drawn back, abdominal muscles slightly tense, knees unlocked (if standing) and feet hip width apart. °· Without letting your nose or shoulders move, slowly tip your right / left ear to your shoulder until your feel a gentle stretch in the muscles on the opposite side of your neck. °· Hold __________ seconds. °Repeat __________ times. Complete this exercise __________ times per day. °STRETCH - Cervical Rotators  °· Stand or sit on a firm surface. Assume a good posture: chest up, shoulders drawn back, abdominal muscles slightly tense, knees unlocked (if standing) and feet hip width apart. °· Keeping your eyes level with the ground, slowly turn your head until you feel a gentle stretch along the back and opposite side of your neck. °· Hold __________ seconds. °Repeat __________ times. Complete this exercise __________ times per day. °RANGE OF MOTION - Neck Circles  °· Stand or sit on a firm surface. Assume a good posture: chest up, shoulders drawn back, abdominal muscles slightly tense, knees unlocked (if standing) and feet hip width apart. °· Gently roll your head down and around from the back   of one shoulder to the back of the other. The motion should never be forced or painful. °· Repeat the motion 10-20 times, or until you feel the neck muscles relax and loosen. °Repeat __________ times. Complete the exercise __________ times per day. °STRENGTHENING EXERCISES - Cervical Strain and Sprain °These exercises may help you when beginning to rehabilitate your injury. They may resolve your symptoms with or  without further involvement from your physician, physical therapist, or athletic trainer. While completing these exercises, remember:  °· Muscles can gain both the endurance and the strength needed for everyday activities through controlled exercises. °· Complete these exercises as instructed by your physician, physical therapist, or athletic trainer. Progress the resistance and repetitions only as guided. °· You may experience muscle soreness or fatigue, but the pain or discomfort you are trying to eliminate should never worsen during these exercises. If this pain does worsen, stop and make certain you are following the directions exactly. If the pain is still present after adjustments, discontinue the exercise until you can discuss the trouble with your clinician. °STRENGTH - Cervical Flexors, Isometric °· Face a wall, standing about 6 inches away. Place a small pillow, a ball about 6-8 inches in diameter, or a folded towel between your forehead and the wall. °· Slightly tuck your chin and gently push your forehead into the soft object. Push only with mild to moderate intensity, building up tension gradually. Keep your jaw and forehead relaxed. °· Hold 10 to 20 seconds. Keep your breathing relaxed. °· Release the tension slowly. Relax your neck muscles completely before you start the next repetition. °Repeat __________ times. Complete this exercise __________ times per day. °STRENGTH- Cervical Lateral Flexors, Isometric  °· Stand about 6 inches away from a wall. Place a small pillow, a ball about 6-8 inches in diameter, or a folded towel between the side of your head and the wall. °· Slightly tuck your chin and gently tilt your head into the soft object. Push only with mild to moderate intensity, building up tension gradually. Keep your jaw and forehead relaxed. °· Hold 10 to 20 seconds. Keep your breathing relaxed. °· Release the tension slowly. Relax your neck muscles completely before you start the next  repetition. °Repeat __________ times. Complete this exercise __________ times per day. °STRENGTH - Cervical Extensors, Isometric  °· Stand about 6 inches away from a wall. Place a small pillow, a ball about 6-8 inches in diameter, or a folded towel between the back of your head and the wall. °· Slightly tuck your chin and gently tilt your head back into the soft object. Push only with mild to moderate intensity, building up tension gradually. Keep your jaw and forehead relaxed. °· Hold 10 to 20 seconds. Keep your breathing relaxed. °· Release the tension slowly. Relax your neck muscles completely before you start the next repetition. °Repeat __________ times. Complete this exercise __________ times per day. °POSTURE AND BODY MECHANICS CONSIDERATIONS - Cervical Strain and Sprain °Keeping correct posture when sitting, standing or completing your activities will reduce the stress put on different body tissues, allowing injured tissues a chance to heal and limiting painful experiences. The following are general guidelines for improved posture. Your physician or physical therapist will provide you with any instructions specific to your needs. While reading these guidelines, remember: °· The exercises prescribed by your provider will help you have the flexibility and strength to maintain correct postures. °· The correct posture provides the optimal environment for your joints to work.   All of your joints have less wear and tear when properly supported by a spine with good posture. This means you will experience a healthier, less painful body. °· Correct posture must be practiced with all of your activities, especially prolonged sitting and standing. Correct posture is as important when doing repetitive low-stress activities (typing) as it is when doing a single heavy-load activity (lifting). °PROLONGED STANDING WHILE SLIGHTLY LEANING FORWARD °When completing a task that requires you to lean forward while standing in one  place for a long time, place either foot up on a stationary 2- to 4-inch high object to help maintain the best posture. When both feet are on the ground, the low back tends to lose its slight inward curve. If this curve flattens (or becomes too large), then the back and your other joints will experience too much stress, fatigue more quickly, and can cause pain.  °RESTING POSITIONS °Consider which positions are most painful for you when choosing a resting position. If you have pain with flexion-based activities (sitting, bending, stooping, squatting), choose a position that allows you to rest in a less flexed posture. You would want to avoid curling into a fetal position on your side. If your pain worsens with extension-based activities (prolonged standing, working overhead), avoid resting in an extended position such as sleeping on your stomach. Most people will find more comfort when they rest with their spine in a more neutral position, neither too rounded nor too arched. Lying on a non-sagging bed on your side with a pillow between your knees, or on your back with a pillow under your knees will often provide some relief. Keep in mind, being in any one position for a prolonged period of time, no matter how correct your posture, can still lead to stiffness. °WALKING °Walk with an upright posture. Your ears, shoulders, and hips should all line up. °OFFICE WORK °When working at a desk, create an environment that supports good, upright posture. Without extra support, muscles fatigue and lead to excessive strain on joints and other tissues. °CHAIR: °· A chair should be able to slide under your desk when your back makes contact with the back of the chair. This allows you to work closely. °· The chair's height should allow your eyes to be level with the upper part of your monitor and your hands to be slightly lower than your elbows. °· Body position: °¨ Your feet should make contact with the floor. If this is not  possible, use a foot rest. °¨ Keep your ears over your shoulders. This will reduce stress on your neck and low back. °  °This information is not intended to replace advice given to you by your health care provider. Make sure you discuss any questions you have with your health care provider. °  °Document Released: 07/11/2005 Document Revised: 08/01/2014 Document Reviewed: 10/23/2008 °[employer] Interactive Patient Education ©2016 [employer] °Motor Vehicle Collision °It is common to have multiple bruises and sore muscles after a motor vehicle collision (MVC). These tend to feel worse for the first 24 hours. You may have the most stiffness and soreness over the first several hours. You may also feel worse when you wake up the first morning after your collision. After this point, you will usually begin to improve with each day. The speed of improvement often depends on the severity of the collision, the number of injuries, and the location and nature of these injuries. °HOME CARE INSTRUCTIONS °· Put ice on the injured area. °·   Put ice in a plastic bag. °· Place a towel between your skin and the bag. °· Leave the ice on for 15-20 minutes, 3-4 times a day, or as directed by your health care provider. °· Drink enough fluids to keep your urine clear or pale yellow. Do not drink alcohol. °· Take a warm shower or bath once or twice a day. This will increase blood flow to sore muscles. °· You may return to activities as directed by your caregiver. Be careful when lifting, as this may aggravate neck or back pain. °· Only take over-the-counter or prescription medicines for pain, discomfort, or fever as directed by your caregiver. Do not use aspirin. This may increase bruising and bleeding. °SEEK IMMEDIATE MEDICAL CARE IF: °· You have numbness, tingling, or weakness in the arms or legs. °· You develop severe headaches not relieved with medicine. °· You have severe neck pain, especially tenderness in the middle of the back of  your neck. °· You have changes in bowel or bladder control. °· There is increasing pain in any area of the body. °· You have shortness of breath, light-headedness, dizziness, or fainting. °· You have chest pain. °· You feel sick to your stomach (nauseous), throw up (vomit), or sweat. °· You have increasing abdominal discomfort. °· There is blood in your urine, stool, or vomit. °· You have pain in your shoulder (shoulder strap areas). °· You feel your symptoms are getting worse. °MAKE SURE YOU: °· Understand these instructions. °· Will watch your condition. °· Will get help right away if you are not doing well or get worse. °  °This information is not intended to replace advice given to you by your health care provider. Make sure you discuss any questions you have with your health care provider. °  °Document Released: 07/11/2005 Document Revised: 08/01/2014 Document Reviewed: 12/08/2010 °[employer] Interactive Patient Education ©2016 [employer] ° °
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
## 2016-04-21 ENCOUNTER — Emergency Department (HOSPITAL_COMMUNITY): Payer: Self-pay

## 2016-04-21 ENCOUNTER — Inpatient Hospital Stay (HOSPITAL_COMMUNITY)
Admission: EM | Admit: 2016-04-21 | Discharge: 2016-04-24 | DRG: 392 | Disposition: A | Discharge status: Home / Self Care | Payer: Self-pay | Attending: Emergency Medicine | Admitting: Emergency Medicine

## 2016-04-21 ENCOUNTER — Encounter (HOSPITAL_COMMUNITY): Payer: Self-pay | Admitting: Emergency Medicine

## 2016-04-21 DIAGNOSIS — K5792 Diverticulitis of intestine, part unspecified, without perforation or abscess without bleeding: Secondary | ICD-10-CM | POA: Diagnosis present

## 2016-04-21 DIAGNOSIS — K5720 Diverticulitis of large intestine with perforation and abscess without bleeding: Principal | ICD-10-CM | POA: Diagnosis present

## 2016-04-21 DIAGNOSIS — K567 Ileus, unspecified: Secondary | ICD-10-CM

## 2016-04-21 DIAGNOSIS — F17210 Nicotine dependence, cigarettes, uncomplicated: Secondary | ICD-10-CM | POA: Diagnosis present

## 2016-04-21 LAB — CBC WITH DIFFERENTIAL/PLATELET
Basophils Absolute: 0 10*3/uL (ref 0.0–0.1)
Basophils Relative: 0 %
EOS PCT: 1 %
Eosinophils Absolute: 0.1 10*3/uL (ref 0.0–0.7)
HEMATOCRIT: 42.6 % (ref 39.0–52.0)
Hemoglobin: 15.4 g/dL (ref 13.0–17.0)
LYMPHS ABS: 2.2 10*3/uL (ref 0.7–4.0)
LYMPHS PCT: 13 %
MCH: 29.5 pg (ref 26.0–34.0)
MCHC: 36.2 g/dL — ABNORMAL HIGH (ref 30.0–36.0)
MCV: 81.6 fL (ref 78.0–100.0)
Monocytes Absolute: 0.9 10*3/uL (ref 0.1–1.0)
Monocytes Relative: 6 %
NEUTROS ABS: 13.3 10*3/uL — AB (ref 1.7–7.7)
Neutrophils Relative %: 80 %
PLATELETS: 197 10*3/uL (ref 150–400)
RBC: 5.22 MIL/uL (ref 4.22–5.81)
RDW: 12.9 % (ref 11.5–15.5)
WBC: 16.5 10*3/uL — AB (ref 4.0–10.5)

## 2016-04-21 LAB — COMPREHENSIVE METABOLIC PANEL
ALT: 33 U/L (ref 17–63)
AST: 25 U/L (ref 15–41)
Albumin: 3.9 g/dL (ref 3.5–5.0)
Alkaline Phosphatase: 85 U/L (ref 38–126)
Anion gap: 11 (ref 5–15)
BUN: 9 mg/dL (ref 6–20)
CHLORIDE: 103 mmol/L (ref 101–111)
CO2: 23 mmol/L (ref 22–32)
CREATININE: 1.3 mg/dL — AB (ref 0.61–1.24)
Calcium: 9.4 mg/dL (ref 8.9–10.3)
GFR calc non Af Amer: >60 mL/min (ref >60)
Glucose, Bld: 101 mg/dL — ABNORMAL HIGH (ref 65–99)
Potassium: 3.7 mmol/L (ref 3.5–5.1)
SODIUM: 137 mmol/L (ref 135–145)
Total Bilirubin: 1.1 mg/dL (ref 0.3–1.2)
Total Protein: 7.5 g/dL (ref 6.5–8.1)

## 2016-04-21 LAB — URINALYSIS, ROUTINE W REFLEX MICROSCOPIC
Bilirubin Urine: NEGATIVE
Glucose, UA: NEGATIVE mg/dL
Hgb urine dipstick: NEGATIVE
Ketones, ur: NEGATIVE mg/dL
LEUKOCYTES UA: NEGATIVE
NITRITE: NEGATIVE
PROTEIN: NEGATIVE mg/dL
Specific Gravity, Urine: 1.024 (ref 1.005–1.030)
pH: 6 (ref 5.0–8.0)

## 2016-04-21 LAB — LIPASE, BLOOD: Lipase: 32 U/L (ref 11–51)

## 2016-04-21 MED ORDER — DIPHENHYDRAMINE HCL 25 MG PO CAPS: 25.0000 mg | ORAL_CAPSULE | Freq: Four times a day (QID) | ORAL | Status: DC | PRN

## 2016-04-21 MED ORDER — ONDANSETRON 4 MG PO TBDP: 4.0000 mg | ORAL_TABLET | Freq: Four times a day (QID) | ORAL | Status: DC | PRN

## 2016-04-21 MED ORDER — PIPERACILLIN-TAZOBACTAM 3.375 G IVPB 30 MIN
3.3750 g | Freq: Once | INTRAVENOUS | Status: AC
  Administered 2016-04-21: 3.375 g via INTRAVENOUS
  Filled 2016-04-21: qty 50

## 2016-04-21 MED ORDER — SODIUM CHLORIDE 0.9 % IV BOLUS (SEPSIS)
1000.0000 mL | Freq: Once | INTRAVENOUS | Status: AC
  Administered 2016-04-21: 1000 mL via INTRAVENOUS

## 2016-04-21 MED ORDER — MORPHINE SULFATE (PF) 2 MG/ML IV SOLN
2.0000 mg | Freq: Once | INTRAVENOUS | Status: AC
  Administered 2016-04-21: 2 mg via INTRAVENOUS
  Filled 2016-04-21 (×2): qty 1

## 2016-04-21 MED ORDER — HYDROMORPHONE HCL 1 MG/ML IJ SOLN
1.0000 mg | INTRAMUSCULAR | Status: DC | PRN
  Administered 2016-04-21 (×4): 1 mg via INTRAVENOUS
  Administered 2016-04-22 (×5): 2 mg via INTRAVENOUS
  Administered 2016-04-22: 1 mg via INTRAVENOUS
  Administered 2016-04-23: 2 mg via INTRAVENOUS
  Administered 2016-04-23 (×2): 1 mg via INTRAVENOUS
  Administered 2016-04-23: 2 mg via INTRAVENOUS
  Administered 2016-04-23 (×2): 1 mg via INTRAVENOUS
  Filled 2016-04-21: qty 1
  Filled 2016-04-21: qty 2
  Filled 2016-04-21 (×3): qty 1
  Filled 2016-04-21: qty 2
  Filled 2016-04-21 (×2): qty 1
  Filled 2016-04-21: qty 2
  Filled 2016-04-21: qty 1
  Filled 2016-04-21: qty 2
  Filled 2016-04-21 (×2): qty 1
  Filled 2016-04-21 (×2): qty 2
  Filled 2016-04-21 (×2): qty 1

## 2016-04-21 MED ORDER — ONDANSETRON HCL 4 MG/2ML IJ SOLN
4.0000 mg | Freq: Four times a day (QID) | INTRAMUSCULAR | Status: DC | PRN
Start: 2016-04-21 — End: 2016-04-24
  Administered 2016-04-22: 4 mg via INTRAVENOUS
  Filled 2016-04-21: qty 2

## 2016-04-21 MED ORDER — ONDANSETRON HCL 4 MG/2ML IJ SOLN
4.0000 mg | Freq: Once | INTRAMUSCULAR | Status: AC
  Administered 2016-04-21: 4 mg via INTRAVENOUS
  Filled 2016-04-21: qty 2

## 2016-04-21 MED ORDER — METHOCARBAMOL 500 MG PO TABS
500.0000 mg | ORAL_TABLET | Freq: Four times a day (QID) | ORAL | Status: DC | PRN
  Administered 2016-04-21 – 2016-04-23 (×3): 500 mg via ORAL
  Filled 2016-04-21 (×3): qty 1

## 2016-04-21 MED ORDER — ENOXAPARIN SODIUM 40 MG/0.4ML ~~LOC~~ SOLN
40.0000 mg | SUBCUTANEOUS | Status: DC
  Administered 2016-04-21 – 2016-04-23 (×3): 40 mg via SUBCUTANEOUS
  Filled 2016-04-21 (×3): qty 0.4

## 2016-04-21 MED ORDER — PIPERACILLIN-TAZOBACTAM 3.375 G IVPB
3.3750 g | Freq: Three times a day (TID) | INTRAVENOUS | Status: DC
  Administered 2016-04-21 – 2016-04-24 (×9): 3.375 g via INTRAVENOUS
  Filled 2016-04-21 (×11): qty 50

## 2016-04-21 MED ORDER — DIPHENHYDRAMINE HCL 50 MG/ML IJ SOLN: 25.0000 mg | Freq: Four times a day (QID) | INTRAMUSCULAR | Status: DC | PRN

## 2016-04-21 MED ORDER — MORPHINE SULFATE (PF) 2 MG/ML IV SOLN
2.0000 mg | Freq: Once | INTRAVENOUS | Status: AC
  Administered 2016-04-21: 2 mg via INTRAVENOUS
  Filled 2016-04-21: qty 1

## 2016-04-21 MED ORDER — IOPAMIDOL (ISOVUE-300) INJECTION 61%
INTRAVENOUS | Status: AC
  Administered 2016-04-21: 100 mL
  Filled 2016-04-21: qty 100

## 2016-04-21 MED ORDER — SODIUM CHLORIDE 0.9 % IV SOLN
INTRAVENOUS | Status: DC
  Administered 2016-04-21 – 2016-04-24 (×10): via INTRAVENOUS

## 2016-04-21 NOTE — ED Notes (Signed)
PA at bedside.

## 2016-04-21 NOTE — Progress Notes (Signed)
Oriented to unit and orders, Adm RN notified of pt's arrival at 1255.

## 2016-04-21 NOTE — ED Notes (Signed)
Pt aware UA needed.  Unable to provide at this time. Will check back.  

## 2016-04-21 NOTE — H&P (Signed)
[facility] Admission Note  Timothy Harrington 1966/04/09  [id number].    Requesting MD: Dr. Leo Grosser Chief Complaint/Reason for Consult: abdominal pain  HPI:  50 year-old male with PMH of nephrolithiasis requiring surgical intervention about 15 years ago who presents with abdominal pain. Described as lower abdominal pain that started on Sunday and is constant, sharp, and non-radiating. Pain has gradually worsened. Associated with nausea and decreased appetite. Denies vomiting. Last "real BM" was Sunday. +flatus. Patient increased his dietary fiber without resolution of symptoms. Denies any other aggravating or alleviating factors. He denies a PMH of diverticulitis, colon cancer, or abdominal surgeries. He smokes 4-5 cigarettes daily and drinks 1-3 alcoholic beverages daily (prefers liquor). NKDA. Denies the use of regular medications, including blood thinners. His last meal was dinner yesterday. 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  He denies fever, chills, HA, CP, SOB, and urinary symptoms.  ED Workup: UA -negative CBC - WBC 16.5 LFT's, lipase - WNL CT Scan Abd/Pelv W/ - colonic wall thickening of proximal sigmoid with fat stranding and focal pericolonic air, no free intraabdominal air. LLQ skin thickening concerning for small cellulitis/abscess.  ROS: All systems reviewed and otherwise negative except for as above  Family History  Problem Relation Age of Onset  . Cancer Neg Hx   . Hypertension Neg Hx     Past Medical History:  Diagnosis Date  . Kidney stones     Past Surgical History:  Procedure Laterality Date  . KIDNEY STONE SURGERY      Social History:  reports that he has been smoking Cigarettes.  He has been smoking about 0.25 packs per day. He does not have any smokeless tobacco history on file. He reports that he drinks alcohol. He reports that he does not use drugs.  Allergies: No Known Allergies   (Not in a hospital  admission)  Blood pressure 134/82, pulse 84, temperature 98.5 F (36.9 C), temperature source Oral, resp. rate 20, height '5\' 10"'  (1.778 m), weight 195 lb (88.5 kg), SpO2 97 %.   Physical Exam: General: pleasant, WD/WN AA male who is laying in bed in obvious abdominal discomfort HEENT: head is normocephalic, atraumatic.   Heart: regular, rate, and rhythm.  No obvious murmurs, gallops, or rubs noted.  Palpable pedal pulses bilaterally Lungs: CTAB, no wheezes, rhonchi, or rales noted.  Respiratory effort nonlabored Abd: soft, TTP LLQ with guarding, mild distention, +BS, no masses, hernias, or previous surgical scars. MS: all 4 extremities are symmetrical with no cyanosis, clubbing, or edema. Skin: warm and dry with no masses,or rashes; small, 1-2 cm area of induration in LLQ/suprapubic area consistent with a mild folliculitis Psych: appropriate affect. Neuro: A&Ox3, normal speech  Results for orders placed or performed during the hospital encounter of 04/21/16 (from the past 48 hour(s))  Comprehensive metabolic panel     Status: Abnormal   Collection Time: 04/21/16  6:16 AM  Result Value Ref Range   Sodium 137 135 - 145 mmol/L   Potassium 3.7 3.5 - 5.1 mmol/L   Chloride 103 101 - 111 mmol/L   CO2 23 22 - 32 mmol/L   Glucose, Bld 101 (H) 65 - 99 mg/dL   BUN 9 6 - 20 mg/dL   Creatinine, Ser 1.30 (H) 0.61 - 1.24 mg/dL   Calcium 9.4 8.9 - 10.3 mg/dL   Total Protein 7.5 6.5 - 8.1 g/dL   Albumin 3.9 3.5 - 5.0 g/dL   AST 25 15 -  41 U/L   ALT 33 17 - 63 U/L   Alkaline Phosphatase 85 38 - 126 U/L   Total Bilirubin 1.1 0.3 - 1.2 mg/dL   GFR calc non Af Amer >60 >60 mL/min   GFR calc Af Amer >60 >60 mL/min    Comment: (NOTE) The eGFR has been calculated using the CKD EPI equation. This calculation has not been validated in all clinical situations. eGFR's persistently <60 mL/min signify possible Chronic Kidney Disease.    Anion gap 11 5 - 15  Lipase, blood     Status: None   Collection  Time: 04/21/16  6:16 AM  Result Value Ref Range   Lipase 32 11 - 51 U/L  CBC with Differential     Status: Abnormal   Collection Time: 04/21/16  6:16 AM  Result Value Ref Range   WBC 16.5 (H) 4.0 - 10.5 K/uL   RBC 5.22 4.22 - 5.81 MIL/uL   Hemoglobin 15.4 13.0 - 17.0 g/dL   HCT 42.6 39.0 - 52.0 %   MCV 81.6 78.0 - 100.0 fL   MCH 29.5 26.0 - 34.0 pg   MCHC 36.2 (H) 30.0 - 36.0 g/dL   RDW 12.9 11.5 - 15.5 %   Platelets 197 150 - 400 K/uL   Neutrophils Relative % 80 %   Neutro Abs 13.3 (H) 1.7 - 7.7 K/uL   Lymphocytes Relative 13 %   Lymphs Abs 2.2 0.7 - 4.0 K/uL   Monocytes Relative 6 %   Monocytes Absolute 0.9 0.1 - 1.0 K/uL   Eosinophils Relative 1 %   Eosinophils Absolute 0.1 0.0 - 0.7 K/uL   Basophils Relative 0 %   Basophils Absolute 0.0 0.0 - 0.1 K/uL  Urinalysis, Routine w reflex microscopic     Status: None   Collection Time: 04/21/16  8:01 AM  Result Value Ref Range   Color, Urine YELLOW YELLOW   APPearance CLEAR CLEAR   Specific Gravity, Urine 1.024 1.005 - 1.030   pH 6.0 5.0 - 8.0   Glucose, UA NEGATIVE NEGATIVE mg/dL   Hgb urine dipstick NEGATIVE NEGATIVE   Bilirubin Urine NEGATIVE NEGATIVE   Ketones, ur NEGATIVE NEGATIVE mg/dL   Protein, ur NEGATIVE NEGATIVE mg/dL   Nitrite NEGATIVE NEGATIVE   Leukocytes, UA NEGATIVE NEGATIVE    Comment: MICROSCOPIC NOT DONE ON URINES WITH NEGATIVE PROTEIN, BLOOD, LEUKOCYTES, NITRITE, OR GLUCOSE <1000 mg/dL.   Ct Abdomen Pelvis W Contrast  Result Date: 04/21/2016 CLINICAL DATA:  Lower abdominal pain, leukocytosis, suprapubic pain EXAM: CT ABDOMEN AND PELVIS WITH CONTRAST TECHNIQUE: Multidetector CT imaging of the abdomen and pelvis was performed using the standard protocol following bolus administration of intravenous contrast. CONTRAST:  184m ISOVUE-300 IOPAMIDOL (ISOVUE-300) INJECTION 61% COMPARISON:  Report 10/29/2001 no images available FINDINGS: Lower chest: Lung bases are unremarkable. Mild thickening of distal esophageal  wall. Clinical correlation is necessary to exclude gastroesophageal reflux. Hepatobiliary: The gallbladder is not identified. Mild intrahepatic biliary ductal dilatation. Mild fatty infiltration of the liver. Pancreas: Enhanced pancreas is unremarkable. Spleen: Enhanced spleen is unremarkable. Adrenals/Urinary Tract: No adrenal gland mass. Enhanced kidneys are symmetrical in size. No hydronephrosis or hydroureter. Delayed renal images shows bilateral renal symmetrical excretion. Bilateral visualized proximal ureter is unremarkable. Stomach/Bowel: No gastric outlet obstruction. Mild dilated small bowel loops with some air-fluid levels in lower abdomen suspicious for ileus. Less likely enteritis. No small bowel obstruction. Normal appendix containing air noted in axial image 53. No pericecal inflammation. Scattered diverticula are noted descending colon. Few diverticula  are noted proximal sigmoid colon. Axial image 64 there is short segment thickening of colonic wall in proximal sigmoid colon. There is stranding of surrounding fat. Small amount of focal pericolonic air is noted in axial image 65. This is confirmed in coronal image 57. Findings are highly suspicious for perforated contained acute diverticulitis. No evidence of free abdominal air in upper abdomen. Vascular/Lymphatic: No aortic aneurysm. No retroperitoneal or mesenteric adenopathy. Reproductive: Prostate gland and seminal vesicles are unremarkable. Other: No abdominal ascites is noted. There is focal skin thickening and small amount of subcutaneous fluid in left lower anterior abdominal wall please see axial image 69. Findings suspicious for cellulitis. Tiny subcutaneous abscess cannot be excluded. Clinical correlation is necessary. Musculoskeletal: No destructive bony lesions are noted. Sagittal images of the spine shows degenerative changes lower thoracic spine. Small nonspecific bilateral inguinal lymph nodes are noted. IMPRESSION: 1. Mild distended  small bowel loops with some fluid and air-fluid level suspicious for ileus or enteritis. Less likely small bowel obstruction. 2. Axial image 64 there is short segment thickening of colonic wall in proximal sigmoid colon. There is stranding of surrounding fat. Small amount of focal pericolonic air is noted in axial image 65. This is confirmed in coronal image 57. Findings are highly suspicious for perforated contained acute diverticulitis. 3. No evidence of free abdominal air in upper abdomen. 4. Normal appendix.  No pericecal inflammation. 5. No hydronephrosis or hydroureter. 6. There is focal skin thickening and small amount of subcutaneous fluid in left lower anterior abdominal wall please see axial image 69. Findings suspicious for cellulitis. Tiny subcutaneous abscess cannot be excluded. Clinical correlation is necessary. These results were called by telephone at the time of interpretation on 04/21/2016 at 8:52 am to Dr. Gay Filler , who verbally acknowledged these results. Electronically Signed   By: Lahoma Crocker M.D.   On: 04/21/2016 08:53    Assessment/Plan Diverticulitis without abscess formation NPO, bowel rest IV Abx  folliculitis, LLQ - observe, I&D not indicated at this time.   Leukocytosis - 16.5  FEN: NPO, IVF DVT Proph: Lovenox, SCDs ID: [email or web address]  Dispo: Admit. bowel rest and IV abx, serial abdominal exams  Jill Alexanders, [facility] 04/21/2016, 10:26 AM Pager: [phone] Consults: [phone] Mon-Fri 7:00 am-4:30 pm Sat-Sun 7:00 am-11:30 am

## 2016-04-21 NOTE — ED Notes (Signed)
Pt moved out in the hallway to wait for a room.

## 2016-04-21 NOTE — ED Triage Notes (Signed)
Pt reports "really, really bad cramping or gas pains" in his lower abdomen since Sunday.  Pt reports he has not been able to eat since Sunday, nausea started today.

## 2016-04-21 NOTE — ED Provider Notes (Signed)
[facility]-EMERGENCY DEPT Provider Note   CSN: [id number] Arrival date & time: 04/21/16  0440     History   Chief Complaint Chief Complaint  Patient presents with  . Abdominal Pain    HPI Timothy Harrington is a 50 y.o. male.  Timothy Harrington is a 50 y.o. male wit h/o kidney stones presents to ED with complaint of lower abdominal pain. Patient reports pain started on Sunday and has progressively worsened. Located in lower abdomen, described as constant cramping with intermittent sharp, worsening pains. Last BM on Sunday, he reports urgency and straining; however, unable to go. States there is a mass at his rectum protruding that is painful. Passing some flatus. Decrease appetite; however, still taking in fluids. Has associated nausea, chills, and diaphoresis. No fever, vomiting, dysuria, hematuria, back pain, SOB, or CP. H/o kidney stones states pain "feels similar but different." No colonoscopy. Last meal 10pm yesterday.       Past Medical History:  Diagnosis Date  . Kidney stones     There are no active problems to display for this patient.   Past Surgical History:  Procedure Laterality Date  . KIDNEY STONE SURGERY         Home Medications    Prior to Admission medications   Not on File    Family History Family History  Problem Relation Age of Onset  . Cancer Neg Hx   . Hypertension Neg Hx     Social History Social History  Substance Use Topics  . Smoking status: Current Every Day Smoker    Packs/day: 0.25    Types: Cigarettes  . Smokeless tobacco: Not on file  . Alcohol use Yes     Comment: socially     Allergies   Review of patient's allergies indicates no known allergies.   Review of Systems Review of Systems  Constitutional: Positive for chills and diaphoresis. Negative for fever.  HENT: Positive for congestion, postnasal drip, rhinorrhea and sinus pressure. Negative for trouble swallowing.   Eyes: Positive for discharge. Negative for visual  disturbance.  Respiratory: Positive for cough. Negative for shortness of breath.   Cardiovascular: Negative for chest pain.  Gastrointestinal: Positive for abdominal distention, abdominal pain, constipation and nausea. Negative for diarrhea and vomiting.  Genitourinary: Negative for dysuria and hematuria.  Musculoskeletal: Negative for back pain.  Skin: Negative for rash.  Neurological: Negative for syncope.     Physical Exam Updated Vital Signs BP 116/98 (BP Location: Right Arm)   Pulse 98   Temp 99.3 F (37.4 C) (Oral)   Resp 20   Ht 5\' 10"  (1.778 m)   Wt 88.5 kg   SpO2 99%   BMI 27.98 kg/m   Physical Exam  Constitutional: He appears well-developed and well-nourished. No distress.  HENT:  Head: Normocephalic and atraumatic.  Mouth/Throat: Oropharynx is clear and moist. No oropharyngeal exudate.  Eyes: Conjunctivae and EOM are normal. Pupils are equal, round, and reactive to light. Right eye exhibits no discharge. Left eye exhibits no discharge. No scleral icterus.  Neck: Normal range of motion and phonation normal. Neck supple. No neck rigidity. Normal range of motion present.  Cardiovascular: Normal rate, regular rhythm, normal heart sounds and intact distal pulses.   No murmur heard. Pulmonary/Chest: Effort normal and breath sounds normal. No stridor. No respiratory distress. He has no wheezes. He has no rales.  Abdominal: Soft. Bowel sounds are normal. He exhibits no distension. There is tenderness in the suprapubic area and left lower quadrant. There  is guarding. There is no rigidity, no rebound and no CVA tenderness.  Abdomen is soft. TTP of suprapubic and LLQ with guarding.   Genitourinary:  Genitourinary Comments: Chaperone present for duration of exam. External hemorrhoid noted at 10 o'clock position. Strong rectal tone. Mild tenderness at site of hemorrhoid. No masses palpated.    Musculoskeletal: Normal range of motion.  Lymphadenopathy:    He has no cervical  adenopathy.  Neurological: He is alert. He is not disoriented. Coordination and gait normal. GCS eye subscore is 4. GCS verbal subscore is 5. GCS motor subscore is 6.  Skin: Skin is warm and dry. He is not diaphoretic.  Psychiatric: He has a normal mood and affect. His behavior is normal.     ED Treatments / Results  Labs (all labs ordered are listed, but only abnormal results are displayed) Labs Reviewed  COMPREHENSIVE METABOLIC PANEL - Abnormal; Notable for the following:       Result Value   Glucose, Bld 101 (*)    Creatinine, Ser 1.30 (*)    All other components within normal limits  CBC WITH DIFFERENTIAL/PLATELET - Abnormal; Notable for the following:    WBC 16.5 (*)    MCHC 36.2 (*)    Neutro Abs 13.3 (*)    All other components within normal limits  URINALYSIS, ROUTINE W REFLEX MICROSCOPIC (NOT AT [facility])  LIPASE, BLOOD    EKG  EKG Interpretation None       Radiology Ct Abdomen Pelvis W Contrast  Result Date: 04/21/2016 CLINICAL DATA:  Lower abdominal pain, leukocytosis, suprapubic pain EXAM: CT ABDOMEN AND PELVIS WITH CONTRAST TECHNIQUE: Multidetector CT imaging of the abdomen and pelvis was performed using the standard protocol following bolus administration of intravenous contrast. CONTRAST:  ISOVUE-300 IOPAMIDOL (ISOVUE-300) INJECTION 61% COMPARISON:  Report 10/29/2001 no images available FINDINGS: Lower chest: Lung bases are unremarkable. Mild thickening of distal esophageal wall. Clinical correlation is necessary to exclude gastroesophageal reflux. Hepatobiliary: The gallbladder is not identified. Mild intrahepatic biliary ductal dilatation. Mild fatty infiltration of the liver. Pancreas: Enhanced pancreas is unremarkable. Spleen: Enhanced spleen is unremarkable. Adrenals/Urinary Tract: No adrenal gland mass. Enhanced kidneys are symmetrical in size. No hydronephrosis or hydroureter. Delayed renal images shows bilateral renal symmetrical excretion. Bilateral  visualized proximal ureter is unremarkable. Stomach/Bowel: No gastric outlet obstruction. Mild dilated small bowel loops with some air-fluid levels in lower abdomen suspicious for ileus. Less likely enteritis. No small bowel obstruction. Normal appendix containing air noted in axial image 53. No pericecal inflammation. Scattered diverticula are noted descending colon. Few diverticula are noted proximal sigmoid colon. Axial image 64 there is short segment thickening of colonic wall in proximal sigmoid colon. There is stranding of surrounding fat. Small amount of focal pericolonic air is noted in axial image 65. This is confirmed in coronal image 57. Findings are highly suspicious for perforated contained acute diverticulitis. No evidence of free abdominal air in upper abdomen. Vascular/Lymphatic: No aortic aneurysm. No retroperitoneal or mesenteric adenopathy. Reproductive: Prostate gland and seminal vesicles are unremarkable. Other: No abdominal ascites is noted. There is focal skin thickening and small amount of subcutaneous fluid in left lower anterior abdominal wall please see axial image 69. Findings suspicious for cellulitis. Tiny subcutaneous abscess cannot be excluded. Clinical correlation is necessary. Musculoskeletal: No destructive bony lesions are noted. Sagittal images of the spine shows degenerative changes lower thoracic spine. Small nonspecific bilateral inguinal lymph nodes are noted. IMPRESSION: 1. Mild distended small bowel loops with some fluid and air-fluid  level suspicious for ileus or enteritis. Less likely small bowel obstruction. 2. Axial image 64 there is short segment thickening of colonic wall in proximal sigmoid colon. There is stranding of surrounding fat. Small amount of focal pericolonic air is noted in axial image 65. This is confirmed in coronal image 57. Findings are highly suspicious for perforated contained acute diverticulitis. 3. No evidence of free abdominal air in upper  abdomen. 4. Normal appendix.  No pericecal inflammation. 5. No hydronephrosis or hydroureter. 6. There is focal skin thickening and small amount of subcutaneous fluid in left lower anterior abdominal wall please see axial image 69. Findings suspicious for cellulitis. Tiny subcutaneous abscess cannot be excluded. Clinical correlation is necessary. These results were called by telephone at the time of interpretation on 04/21/2016 at 8:52 am to Dr. Arvilla Meres , who verbally acknowledged these results. Electronically Signed   By: Natasha Mead M.D.   On: 04/21/2016 08:53    Procedures Procedures (including critical care time)  Medications Ordered in ED Medications  piperacillin-tazobactam (ZOSYN) IVPB 3.375 g (not administered)  morphine 2 MG/ML injection 2 mg (not administered)  ondansetron (ZOFRAN) injection 4 mg (4 mg Intravenous Given 04/21/16 0639)  morphine 2 MG/ML injection 2 mg (2 mg Intravenous Given 04/21/16 0719)  sodium chloride 0.9 % bolus 1,000 mL (0 mLs Intravenous Stopped 04/21/16 0723)  iopamidol (ISOVUE-300) 61 % injection (100 mLs  Contrast Given 04/21/16 0820)     Initial Impression / Assessment and Plan / ED Course  I have reviewed the triage vital signs and the nursing notes.  Pertinent labs & imaging results that were available during my care of the patient were reviewed by me and considered in my medical decision making (see chart for details).  Clinical Course  Value Comment By Time  CT Abdomen Pelvis W Contrast Reviewed Lona Kettle, PA-C 09/28 0900   Patient presents to ED with lower abdominal pain and nausea. Patient is afebrile and appears in discomfort. Vital signs are stable. Physical exam remarkable for TTP of lower abdomen with guarding. Will check basic labs, IVF fluids, and pain medicine.   Lipase nml - low suspicion for acute pancreatitis. CMP remarkable for mildly elevated creatinine at 1.3; however, stable compared to previous. Leukocytosis present.  Given leukocytosis and LLQ/suprapubic pain concern for possible diverticulitis. Will CT abd/pelvis.   On re-evaluation, pt endorses improvement in pain. Ct abd/pelvis remarkable for focal perforated diverticulitis no evidence of free air in upper abdomen, possible small bowel ileus, and possible subcutaneous cellulitis in left lower abdomen. IVF ABX initiated. Will consult surgery regarding perforated diverticulitis.   10:00AM: Spoke with Bailey Mech of General Surgery, greatly appreciate her time, will admit patient.   Final Clinical Impressions(s) / ED Diagnoses   Final diagnoses:  Diverticulitis of large intestine with perforation without bleeding  [facility])    New Prescriptions New Prescriptions   No medications on file     Lona Kettle, PA-C 04/21/16 1005    Arby Barrette, MD 04/29/16 [phone]

## 2016-04-21 NOTE — ED Notes (Signed)
Pt reports abdominal pain 8/10. Requests previously denied pain medication. Will administer(See MAR). PA aware.

## 2016-04-22 ENCOUNTER — Encounter (HOSPITAL_COMMUNITY): Payer: Self-pay | Admitting: *Deleted

## 2016-04-22 LAB — BASIC METABOLIC PANEL WITH GFR
Anion gap: 7 (ref 5–15)
BUN: 10 mg/dL (ref 6–20)
CO2: 25 mmol/L (ref 22–32)
Calcium: 8.7 mg/dL — ABNORMAL LOW (ref 8.9–10.3)
Chloride: 104 mmol/L (ref 101–111)
Creatinine, Ser: 1.56 mg/dL — ABNORMAL HIGH (ref 0.61–1.24)
GFR calc Af Amer: 57 mL/min — ABNORMAL LOW
GFR calc non Af Amer: 49 mL/min — ABNORMAL LOW
Glucose, Bld: 81 mg/dL (ref 65–99)
Potassium: 4.1 mmol/L (ref 3.5–5.1)
Sodium: 136 mmol/L (ref 135–145)

## 2016-04-22 LAB — CBC
HCT: 39.6 % (ref 39.0–52.0)
Hemoglobin: 14 g/dL (ref 13.0–17.0)
MCH: 29.3 pg (ref 26.0–34.0)
MCHC: 35.4 g/dL (ref 30.0–36.0)
MCV: 82.8 fL (ref 78.0–100.0)
Platelets: 188 K/uL (ref 150–400)
RBC: 4.78 MIL/uL (ref 4.22–5.81)
RDW: 12.9 % (ref 11.5–15.5)
WBC: 12.3 K/uL — ABNORMAL HIGH (ref 4.0–10.5)

## 2016-04-22 NOTE — Progress Notes (Signed)
[facility]  Progress Note  Subjective: States he slept well overnight and pain well controlled with current medication. States he feels that pain is improving with reduced pain in between doses of pain medication. No nausea or vomiting. No issues with urination or ambulation.   Objective: Vital signs in last 24 hours: Temp:  [98 F (36.7 C)-98.8 F (37.1 C)] 98.2 F (36.8 C) (09/29 0501) Pulse Rate:  [73-96] 79 (09/29 0501) Resp:  [16-25] 16 (09/29 0501) BP: (95-144)/(60-89) 97/83 (09/29 0501) SpO2:  [96 %-100 %] 99 % (09/29 0501) Weight:  [88.5 kg (195 lb 1.7 oz)] 88.5 kg (195 lb 1.7 oz) (09/28 1257) Last BM Date: 04/21/16  Intake/Output from previous day: 09/28 0701 - 09/29 0700 In: 3004 [I.V.:1905; IV Piggyback:1099] Out: 650 [Urine:650] Intake/Output this shift: Total I/O In: 0  Out: 200 [Urine:200]  PE: General: pleasant, WD/WN AA male. Appears comfortable lying in bed. HEENT: mucous membranes appear dry. No icterus. Heart: regular, rate, and rhythm. Lungs: CTAB, no wheezes, rhonchi, or rales noted.  Respiratory effort normal. Abd: soft, TTP across lower portion of abdomen without area of focal tenderness, no distention, hypoactive BS, no masses or hernia.   Lab Results:   Recent Labs  04/21/16 0616 04/22/16 0639  WBC 16.5* 12.3*  HGB 15.4 14.0  HCT 42.6 39.6  PLT 197 188   BMET  Recent Labs  04/21/16 0616 04/22/16 0639  NA 137 136  K 3.7 4.1  CL 103 104  CO2 23 25  GLUCOSE 101* 81  BUN 9 10  CREATININE 1.30* 1.56*  CALCIUM 9.4 8.7*   PT/INR No results for input(s): LABPROT, INR in the last 72 hours. CMP     Component Value Date/Time   NA 136 04/22/2016 0639   K 4.1 04/22/2016 0639   CL 104 04/22/2016 0639   CO2 25 04/22/2016 0639   GLUCOSE 81 04/22/2016 0639   BUN 10 04/22/2016 0639   CREATININE 1.56 (H) 04/22/2016 0639   CALCIUM 8.7 (L) 04/22/2016 0639   PROT 7.5 04/21/2016 0616   ALBUMIN 3.9 04/21/2016 0616   AST 25  04/21/2016 0616   ALT 33 04/21/2016 0616   ALKPHOS 85 04/21/2016 0616   BILITOT 1.1 04/21/2016 0616   GFRNONAA 49 (L) 04/22/2016 0639   GFRAA 57 (L) 04/22/2016 0639   Lipase     Component Value Date/Time   LIPASE 32 04/21/2016 0616       Studies/Results: Ct Abdomen Pelvis W Contrast  Result Date: 04/21/2016 CLINICAL DATA:  Lower abdominal pain, leukocytosis, suprapubic pain EXAM: CT ABDOMEN AND PELVIS WITH CONTRAST TECHNIQUE: Multidetector CT imaging of the abdomen and pelvis was performed using the standard protocol following bolus administration of intravenous contrast. CONTRAST:  100mL ISOVUE-300 IOPAMIDOL (ISOVUE-300) INJECTION 61% COMPARISON:  Report 10/29/2001 no images available FINDINGS: Lower chest: Lung bases are unremarkable. Mild thickening of distal esophageal wall. Clinical correlation is necessary to exclude gastroesophageal reflux. Hepatobiliary: The gallbladder is not identified. Mild intrahepatic biliary ductal dilatation. Mild fatty infiltration of the liver. Pancreas: Enhanced pancreas is unremarkable. Spleen: Enhanced spleen is unremarkable. Adrenals/Urinary Tract: No adrenal gland mass. Enhanced kidneys are symmetrical in size. No hydronephrosis or hydroureter. Delayed renal images shows bilateral renal symmetrical excretion. Bilateral visualized proximal ureter is unremarkable. Stomach/Bowel: No gastric outlet obstruction. Mild dilated small bowel loops with some air-fluid levels in lower abdomen suspicious for ileus. Less likely enteritis. No small bowel obstruction. Normal appendix containing air noted in axial image 53. No pericecal inflammation. Scattered  diverticula are noted descending colon. Few diverticula are noted proximal sigmoid colon. Axial image 64 there is short segment thickening of colonic wall in proximal sigmoid colon. There is stranding of surrounding fat. Small amount of focal pericolonic air is noted in axial image 65. This is confirmed in coronal image  57. Findings are highly suspicious for perforated contained acute diverticulitis. No evidence of free abdominal air in upper abdomen. Vascular/Lymphatic: No aortic aneurysm. No retroperitoneal or mesenteric adenopathy. Reproductive: Prostate gland and seminal vesicles are unremarkable. Other: No abdominal ascites is noted. There is focal skin thickening and small amount of subcutaneous fluid in left lower anterior abdominal wall please see axial image 69. Findings suspicious for cellulitis. Tiny subcutaneous abscess cannot be excluded. Clinical correlation is necessary. Musculoskeletal: No destructive bony lesions are noted. Sagittal images of the spine shows degenerative changes lower thoracic spine. Small nonspecific bilateral inguinal lymph nodes are noted. IMPRESSION: 1. Mild distended small bowel loops with some fluid and air-fluid level suspicious for ileus or enteritis. Less likely small bowel obstruction. 2. Axial image 64 there is short segment thickening of colonic wall in proximal sigmoid colon. There is stranding of surrounding fat. Small amount of focal pericolonic air is noted in axial image 65. This is confirmed in coronal image 57. Findings are highly suspicious for perforated contained acute diverticulitis. 3. No evidence of free abdominal air in upper abdomen. 4. Normal appendix.  No pericecal inflammation. 5. No hydronephrosis or hydroureter. 6. There is focal skin thickening and small amount of subcutaneous fluid in left lower anterior abdominal wall please see axial image 69. Findings suspicious for cellulitis. Tiny subcutaneous abscess cannot be excluded. Clinical correlation is necessary. These results were called by telephone at the time of interpretation on 04/21/2016 at 8:52 am to Dr. Arvilla Meres , who verbally acknowledged these results. Electronically Signed   By: Natasha Mead M.D.   On: 04/21/2016 08:53    Anti-infectives: Anti-infectives    Start     Dose/Rate Route Frequency Ordered  Stop   04/21/16 1015  piperacillin-tazobactam (ZOSYN) IVPB 3.375 g     3.375 g 12.5 mL/hr over 240 Minutes Intravenous Every 8 hours 04/21/16 1011     04/21/16 0930  piperacillin-tazobactam (ZOSYN) IVPB 3.375 g     3.375 g 100 mL/hr over 30 Minutes Intravenous  Once 04/21/16 0918 04/21/16 1115       Assessment/Plan HD#2. Admitted for sigmoid diverticulitis without evidence of bowel perforation or abscess formation. Currently no indication for urgent surgical intervention. Responding favorably to bowel rest, IV hydration, IV Abx with clinical improvement of pain and improving leukocytosis.  No evidence of peritonitis. Will increase rate of IV hydration given elevation of creatinine and dry oral mucous membranes. Suspect patient will need to remain NPO for another 12-24 hours and can begin with clear liquid diet.  DVT/PE prophylaxis: Lovenox. Ambulation ad lib. ID: Zosyn. Afebrile.   LOS: 1 day    LEE Wafa Martes 04/22/2016, 9:05 AM Pager: [phone]

## 2016-04-23 NOTE — Progress Notes (Signed)
Patient ID: Timothy HabermannClyde T Rahe, male   DOB: 06/11/1966, 50 y.o.   MRN: [id number]  [facility]     Subjective: Feeling slightly better today, did not require as much pain medication yesterday. No BM but +flatus, this also helped decrease pain.  Objective: Vital signs in last 24 hours: Temp:  [98.2 F (36.8 C)-98.7 F (37.1 C)] 98.2 F (36.8 C) (09/30 0453) Pulse Rate:  [82-87] 87 (09/30 0453) Resp:  [16-18] 18 (09/30 0453) BP: (144-163)/(84-91) 144/91 (09/30 0453) SpO2:  [98 %-99 %] 98 % (09/30 0453) Last BM Date: 04/21/16  Intake/Output from previous day: 09/29 0701 - 09/30 0700 In: 2672.9 [I.V.:2516.9; IV Piggyback:156] Out: 2750 [Urine:2750] Intake/Output this shift: Total I/O In: 2003 [I.V.:1838; IV Piggyback:165] Out: 500 [Urine:500]  PE: Gen:  Alert, NAD, pleasant Abd: soft, mild TTP LLQ, mild distention, +BS  Lab Results:   Recent Labs  04/21/16 0616 04/22/16 0639  WBC 16.5* 12.3*  HGB 15.4 14.0  HCT 42.6 39.6  PLT 197 188   BMET  Recent Labs  04/21/16 0616 04/22/16 0639  NA 137 136  K 3.7 4.1  CL 103 104  CO2 23 25  GLUCOSE 101* 81  BUN 9 10  CREATININE 1.30* 1.56*  CALCIUM 9.4 8.7*   PT/INR No results for input(s): LABPROT, INR in the last 72 hours. CMP     Component Value Date/Time   NA 136 04/22/2016 0639   K 4.1 04/22/2016 0639   CL 104 04/22/2016 0639   CO2 25 04/22/2016 0639   GLUCOSE 81 04/22/2016 0639   BUN 10 04/22/2016 0639   CREATININE 1.56 (H) 04/22/2016 0639   CALCIUM 8.7 (L) 04/22/2016 0639   PROT 7.5 04/21/2016 0616   ALBUMIN 3.9 04/21/2016 0616   AST 25 04/21/2016 0616   ALT 33 04/21/2016 0616   ALKPHOS 85 04/21/2016 0616   BILITOT 1.1 04/21/2016 0616   GFRNONAA 49 (L) 04/22/2016 0639   GFRAA 57 (L) 04/22/2016 0639   Lipase     Component Value Date/Time   LIPASE 32 04/21/2016 0616       Studies/Results: No results found.  Anti-infectives: Anti-infectives    Start     Dose/Rate  Route Frequency Ordered Stop   04/21/16 1015  piperacillin-tazobactam (ZOSYN) IVPB 3.375 g     3.375 g 12.5 mL/hr over 240 Minutes Intravenous Every 8 hours 04/21/16 1011     04/21/16 0930  piperacillin-tazobactam (ZOSYN) IVPB 3.375 g     3.375 g 100 mL/hr over 30 Minutes Intravenous  Once 04/21/16 0918 04/21/16 1115       Assessment/Plan Diverticulitis without abscess formation - CT 9/28  - slight improvement in LLQ pain from yesterday - advance to clears (take very slowly), mobilize  Leukocytosis - down to 12.3 yesterday  FEN: clears, IVF DVT Proph: Lovenox, SCDs ID: Zosyn 9/28>>  Dispo: advance to clears but patient knows to take very slowly, mobilize. CBC in AM.   LOS: 2 days    Edson SnowballBROOKE A MILLER , [facility] 04/23/2016, 10:28 AM Pager: [phone] Consults: [phone] Mon-Fri 7:00 am-4:30 pm Sat-Sun 7:00 am-11:30 am

## 2016-04-24 ENCOUNTER — Encounter (INDEPENDENT_AMBULATORY_CARE_PROVIDER_SITE_OTHER): Payer: Self-pay | Admitting: Physician Assistant

## 2016-04-24 LAB — CBC
HEMATOCRIT: 38.1 % — AB (ref 39.0–52.0)
Hemoglobin: 13.8 g/dL (ref 13.0–17.0)
MCH: 29.3 pg (ref 26.0–34.0)
MCHC: 36.2 g/dL — ABNORMAL HIGH (ref 30.0–36.0)
MCV: 80.9 fL (ref 78.0–100.0)
PLATELETS: 190 10*3/uL (ref 150–400)
RBC: 4.71 MIL/uL (ref 4.22–5.81)
RDW: 12.4 % (ref 11.5–15.5)
WBC: 7 10*3/uL (ref 4.0–10.5)

## 2016-04-24 MED ORDER — SACCHAROMYCES BOULARDII 250 MG PO CAPS: 250.0000 mg | ORAL_CAPSULE | Freq: Two times a day (BID) | ORAL | Status: DC

## 2016-04-24 MED ORDER — AMOXICILLIN-POT CLAVULANATE 875-125 MG PO TABS
1.0000 | ORAL_TABLET | Freq: Two times a day (BID) | ORAL | Status: DC
  Administered 2016-04-24: 1 via ORAL
  Filled 2016-04-24: qty 1

## 2016-04-24 MED ORDER — AMOXICILLIN-POT CLAVULANATE 875-125 MG PO TABS: 1.0000 | ORAL_TABLET | Freq: Two times a day (BID) | ORAL | 0 refills | Status: AC

## 2016-04-24 MED ORDER — OXYCODONE HCL 5 MG PO TABS: 5.0000 mg | ORAL_TABLET | Freq: Three times a day (TID) | ORAL | 0 refills | Status: DC | PRN

## 2016-04-24 MED ORDER — OXYCODONE HCL 5 MG PO TABS
5.0000 mg | ORAL_TABLET | ORAL | Status: DC | PRN
Start: 2016-04-24 — End: 2016-04-24

## 2016-04-24 NOTE — Discharge Instructions (Signed)
Please follow-up with your primary care physician in the next few weeks.  Low-Fiber Diet Fiber is found in fruits, vegetables, and whole grains. A low-fiber diet restricts fibrous foods that are not digested in the small intestine. A diet containing about 10-15 grams of fiber per day is considered low fiber. Low-fiber diets may be used to:  Promote healing and rest the bowel during intestinal flare-ups.  Prevent blockage of a partially obstructed or narrowed gastrointestinal tract.  Reduce fecal weight and volume.  Slow the movement of feces. You may be on a low-fiber diet as a transitional diet following surgery, after an injury (trauma), or because of a short (acute) or lifelong (chronic) illness. Your health care provider will determine the length of time you need to stay on this diet.  WHAT DO I NEED TO KNOW ABOUT A LOW-FIBER DIET? Always check the fiber content on the packaging's Nutrition Facts label, especially on foods from the grains list. Ask your dietitian if you have questions about specific foods that are related to your condition, especially if the food is not listed below. In general, a low-fiber food will have less than 2 g of fiber. WHAT FOODS CAN I EAT? Grains All breads and crackers made with white flour. Sweet rolls, doughnuts, waffles, pancakes, [place] toast, bagels. Pretzels, Melba toast, zwieback. Well-cooked cereals, such as cornmeal, farina, or cream cereals. Dry cereals that do not contain whole grains, fruit, or nuts, such as refined corn, wheat, rice, and oat cereals. Potatoes prepared any way without skins, plain pastas and noodles, refined white rice. Use white flour for baking and making sauces. Use allowed list of grains for casseroles, dumplings, and puddings.  Vegetables Strained tomato and vegetable juices. Fresh lettuce, cucumber, spinach. Well-cooked (no skin or pulp) or canned vegetables, such as asparagus, bean sprouts, beets, carrots, green beans, mushrooms,  potatoes, pumpkin, spinach, yellow squash, tomato sauce/puree, turnips, yams, and zucchini. Keep servings limited to  cup.  Fruits All fruit juices except prune juice. Cooked or canned fruits without skin and seeds, such as applesauce, apricots, cherries, fruit cocktail, grapefruit, grapes, mandarin oranges, melons, peaches, pears, pineapple, and plums. Fresh fruits without skin, such as apricots, avocados, bananas, melons, pineapple, nectarines, and peaches. Keep servings limited to  cup or 1 piece.  Meat and Other Protein Sources Ground or well-cooked tender beef, ham, veal, lamb, pork, or poultry. Eggs, plain cheese. Fish, oysters, shrimp, lobster, and other seafood. Liver, organ meats. Smooth nut butters. Dairy All milk products and alternative dairy substitutes, such as soy, rice, almond, and coconut, not containing added whole nuts, seeds, or added fruit. Beverages Decaf coffee, fruit, and vegetable juices or smoothies (small amounts, with no pulp or skins, and with fruits from allowed list), sports drinks, herbal tea. Condiments Ketchup, mustard, vinegar, cream sauce, cheese sauce, cocoa powder. Spices in moderation, such as allspice, basil, bay leaves, celery powder or leaves, cinnamon, cumin powder, curry powder, ginger, mace, marjoram, onion or garlic powder, oregano, paprika, parsley flakes, ground pepper, rosemary, sage, savory, tarragon, thyme, and turmeric. Sweets and Desserts Plain cakes and cookies, pie made with allowed fruit, pudding, custard, cream pie. Gelatin, fruit, ice, sherbet, frozen ice pops. Ice cream, ice milk without nuts. Plain hard candy, honey, jelly, molasses, syrup, sugar, chocolate syrup, gumdrops, marshmallows. Limit overall sugar intake.  Fats and Oil Margarine, butter, cream, mayonnaise, salad oils, plain salad dressings made from allowed foods. Choose healthy fats such as olive oil, canola oil, and omega-3 fatty acids (such as found in  salmon or tuna) when  possible.  Other Bouillon, broth, or cream soups made from allowed foods. Any strained soup. Casseroles or mixed dishes made with allowed foods. The items listed above may not be a complete list of recommended foods or beverages. Contact your dietitian for more options.  WHAT FOODS ARE NOT RECOMMENDED? Grains All whole wheat and whole grain breads and crackers. Multigrains, rye, bran seeds, nuts, or coconut. Cereals containing whole grains, multigrains, bran, coconut, nuts, raisins. Cooked or dry oatmeal, steel-cut oats. Coarse wheat cereals, granola. Cereals advertised as high fiber. Potato skins. Whole grain pasta, wild or brown rice. Popcorn. Coconut flour. Bran, buckwheat, corn bread, multigrains, rye, wheat germ.  Vegetables Fresh, cooked or canned vegetables, such as artichokes, asparagus, beet greens, broccoli, Brussels sprouts, cabbage, celery, cauliflower, corn, eggplant, kale, legumes or beans, okra, peas, and tomatoes. Avoid large servings of any vegetables, especially raw vegetables.  Fruits Fresh fruits, such as apples with or without skin, berries, cherries, figs, grapes, grapefruit, guavas, kiwis, mangoes, oranges, papayas, pears, persimmons, pineapple, and pomegranate. Prune juice and juices with pulp, stewed or dried prunes. Dried fruits, dates, raisins. Fruit seeds or skins. Avoid large servings of all fresh fruits. Meats and Other Protein Sources Tough, fibrous meats with gristle. Chunky nut butter. Cheese made with seeds, nuts, or other foods not recommended. Nuts, seeds, legumes (beans, including baked beans), dried peas, beans, lentils.  Dairy Yogurt or cheese that contains nuts, seeds, or added fruit.  Beverages Fruit juices with high pulp, prune juice. Caffeinated coffee and teas.  Condiments Coconut, maple syrup, pickles, olives. Sweets and Desserts Desserts, cookies, or candies that contain nuts or coconut, chunky peanut butter, dried fruits. Jams, preserves with seeds,  marmalade. Large amounts of sugar and sweets. Any other dessert made with fruits from the not recommended list.  Other Soups made from vegetables that are not recommended or that contain other foods not recommended.  The items listed above may not be a complete list of foods and beverages to avoid. Contact your dietitian for more information.   This information is not intended to replace advice given to you by your health care provider. Make sure you discuss any questions you have with your health care provider.   Document Released: 12/31/2001 Document Revised: 07/16/2013 Document Reviewed: 06/03/2013 Elsevier Interactive Patient Education 2016 [employer]    Diverticulitis Diverticulitis is when small pockets that have formed in your colon (large intestine) become infected or swollen. HOME CARE  Follow your doctor's instructions.  Follow a special diet if told by your doctor.  When you feel better, your doctor may tell you to change your diet. You may be told to eat a lot of fiber. Fruits and vegetables are good sources of fiber. Fiber makes it easier to poop (have bowel movements).  Take supplements or probiotics as told by your doctor.  Only take medicines as told by your doctor.  Keep all follow-up visits with your doctor. GET HELP IF:  Your pain does not get better.  You have a hard time eating food.  You are not pooping like normal. GET HELP RIGHT AWAY IF:  Your pain gets worse.  Your problems do not get better.  Your problems suddenly get worse.  You have a fever.  You keep throwing up (vomiting).  You have bloody or black, tarry poop (stool). MAKE SURE YOU:   Understand these instructions.  Will watch your condition.  Will get help right away if you are not doing well or  get worse.   This information is not intended to replace advice given to you by your health care provider. Make sure you discuss any questions you have with your health care  provider.   Document Released: 12/28/2007 Document Revised: 07/16/2013 Document Reviewed: 06/05/2013 Elsevier Interactive Patient Education [employer]

## 2016-04-24 NOTE — Progress Notes (Signed)
Patient ID: Timothy HabermannClyde T Harrington, male   DOB: [birth year], 50 y.o.   MRN: [id number]  [facility]     Subjective: Feeling great. Denies any abdominal pain. Tolerating clears. Had BM yesterday which also helped his pain. Would really like to go home today so he can go to work tomorrow, he just started a new job and does not want to miss any more days.  Objective: Vital signs in last 24 hours: Temp:  [98.2 F (36.8 C)-98.5 F (36.9 C)] 98.3 F (36.8 C) (10/01 0603) Pulse Rate:  [62-92] 62 (10/01 0603) Resp:  [18-20] 20 (10/01 0603) BP: (123-156)/(83-94) 123/83 (10/01 0603) SpO2:  [98 %-100 %] 100 % (10/01 0603) Last BM Date: 04/21/16  Intake/Output from previous day: 09/30 0701 - 10/01 0700 In: 5817 [P.O.:952; I.V.:4623; IV Piggyback:242] Out: 1100 [Urine:1100] Intake/Output this shift: No intake/output data recorded.  PE: Gen:  Alert, NAD, pleasant Abd: soft, ND, very little tender LLQ, +BS   Lab Results:   Recent Labs  04/22/16 0639 04/24/16 0519  WBC 12.3* 7.0  HGB 14.0 13.8  HCT 39.6 38.1*  PLT 188 190   BMET  Recent Labs  04/22/16 0639  NA 136  K 4.1  CL 104  CO2 25  GLUCOSE 81  BUN 10  CREATININE 1.56*  CALCIUM 8.7*   PT/INR No results for input(s): LABPROT, INR in the last 72 hours. CMP     Component Value Date/Time   NA 136 04/22/2016 0639   K 4.1 04/22/2016 0639   CL 104 04/22/2016 0639   CO2 25 04/22/2016 0639   GLUCOSE 81 04/22/2016 0639   BUN 10 04/22/2016 0639   CREATININE 1.56 (H) 04/22/2016 0639   CALCIUM 8.7 (L) 04/22/2016 0639   PROT 7.5 04/21/2016 0616   ALBUMIN 3.9 04/21/2016 0616   AST 25 04/21/2016 0616   ALT 33 04/21/2016 0616   ALKPHOS 85 04/21/2016 0616   BILITOT 1.1 04/21/2016 0616   GFRNONAA 49 (L) 04/22/2016 0639   GFRAA 57 (L) 04/22/2016 0639   Lipase     Component Value Date/Time   LIPASE 32 04/21/2016 0616       Studies/Results: No results  found.  Anti-infectives: Anti-infectives    Start     Dose/Rate Route Frequency Ordered Stop   04/21/16 1015  piperacillin-tazobactam (ZOSYN) IVPB 3.375 g     3.375 g 12.5 mL/hr over 240 Minutes Intravenous Every 8 hours 04/21/16 1011     04/21/16 0930  piperacillin-tazobactam (ZOSYN) IVPB 3.375 g     3.375 g 100 mL/hr over 30 Minutes Intravenous  Once 04/21/16 0918 04/21/16 1115       Assessment/Plan Diverticulitis without abscess formation - CT 9/28  - no pain today, tolerating clears. BM yesterday.  Leukocytosis - down to 7 today, afebrile  FEN: clears, IVF DVT Proph: Lovenox, SCDs ID: Zosyn 9/28>>10/1  Dispo: advance to soft diet and transition to PO antibiotics. Continue mobilization. Patient would like to go home this afternoon but I want to make sure he tolerates diet and antibiotic change. I will arrange everything for discharge and if he is tolerating changes his nurse will let us know later today.   LOS: 3 days    Edson SnowballBROOKE A MILLER , [facility] 04/24/2016, 7:58 AM Pager: [phone] Consults: [phone] Mon-Fri 7:00 am-4:30 pm Sat-Sun 7:00 am-11:30 am

## 2016-04-24 NOTE — Progress Notes (Addendum)
Patient discharged to home with prescriptions and instructions. 

## 2016-04-26 LAB — CULTURE, BLOOD (ROUTINE X 2)
Culture: NO GROWTH
Culture: NO GROWTH

## 2016-04-27 NOTE — Discharge Summary (Signed)
[facility] Discharge Summary   Patient ID: Timothy HabermannClyde T Harrington MRN: [id number] DOB/AGE: 1965-12-18 50 y.o.  Admit date: 04/21/2016 Discharge date: 04/27/2016  Admitting Diagnosis: Diverticulitis of large intestine with perforation without bleeding  Discharge Diagnosis Patient Active Problem List   Diagnosis Date Noted  . Diverticulitis 04/21/2016    Consultants None  Imaging: CT abdomen pelvis w contrast 04/21/16: 1. Mild distended small bowel loops with some fluid and air-fluid level suspicious for ileus or enteritis. Less likely small bowel obstruction. 2. Axial image 64 there is short segment thickening of colonic wall in proximal sigmoid colon. There is stranding of surrounding fat. Small amount of focal pericolonic air is noted in axial image 65. This is confirmed in coronal image 57. Findings are highly suspicious for perforated contained acute diverticulitis. 3. No evidence of free abdominal air in upper abdomen. 4. Normal appendix.  No pericecal inflammation. 5. No hydronephrosis or hydroureter. 6. There is focal skin thickening and small amount of subcutaneous fluid in left lower anterior abdominal wall please see axial image 69. Findings suspicious for cellulitis. Tiny subcutaneous abscess cannot be excluded. Clinical correlation is necessary.  Procedures None  Hospital Course:  Timothy Harrington is a 50yo male [facility] with 5 days of lower abdominal pain that was gradually worsening. He also had associated nausea and anorexia.  Workup showed leukocytosis and diverticulitis without abscess formation.  Patient was admitted for IV antibiotics and bowel rest. His pain gradually improved and his diet was slowly advanced as tolerated. WBC returned to normal. On 04/24/16 he was tolerating diet, pain well controlled, vital signs stable, and felt stable for discharge home. He was discharged on 1 week of oral antibiotics and will follow-up with his  PCP.  Physical Exam: Gen: Alert, NAD, pleasant Abd: soft, ND, very little tender LLQ, +BS    Medication List    TAKE these medications   amoxicillin-clavulanate 875-125 MG tablet Commonly known as:  AUGMENTIN Take 1 tablet by mouth 2 (two) times daily.   oxyCODONE 5 MG immediate release tablet Commonly known as:  ROXICODONE Take 1 tablet (5 mg total) by mouth every 8 (eight) hours as needed.   saccharomyces boulardii 250 MG capsule Commonly known as:  FLORASTOR Take 1 capsule (250 mg total) by mouth 2 (two) times daily. You can get a probiotic over the counter.          Signed: Edson SnowballBROOKE A MILLER, [facility] 04/27/2016, 12:04 PM Pager: [phone] Consults: [phone] Mon-Fri 7:00 am-4:30 pm Sat-Sun 7:00 am-11:30 am

## 2017-08-01 ENCOUNTER — Ambulatory Visit (HOSPITAL_COMMUNITY)
Admission: EM | Admit: 2017-08-01 | Discharge: 2017-08-01 | Disposition: A | Discharge status: Home / Self Care | Payer: 59 | Attending: Internal Medicine | Admitting: Internal Medicine

## 2017-08-01 ENCOUNTER — Encounter (HOSPITAL_COMMUNITY): Payer: Self-pay | Admitting: Emergency Medicine

## 2017-08-01 DIAGNOSIS — Z202 Contact with and (suspected) exposure to infections with a predominantly sexual mode of transmission: Secondary | ICD-10-CM | POA: Insufficient documentation

## 2017-08-01 DIAGNOSIS — N342 Other urethritis: Secondary | ICD-10-CM | POA: Diagnosis not present

## 2017-08-01 DIAGNOSIS — R300 Dysuria: Secondary | ICD-10-CM

## 2017-08-01 DIAGNOSIS — F17210 Nicotine dependence, cigarettes, uncomplicated: Secondary | ICD-10-CM | POA: Insufficient documentation

## 2017-08-01 DIAGNOSIS — Z113 Encounter for screening for infections with a predominantly sexual mode of transmission: Secondary | ICD-10-CM | POA: Diagnosis not present

## 2017-08-01 DIAGNOSIS — Z711 Person with feared health complaint in whom no diagnosis is made: Secondary | ICD-10-CM

## 2017-08-01 DIAGNOSIS — Z87442 Personal history of urinary calculi: Secondary | ICD-10-CM | POA: Diagnosis not present

## 2017-08-01 LAB — POCT URINALYSIS DIP (DEVICE)
BILIRUBIN URINE: NEGATIVE
Glucose, UA: NEGATIVE mg/dL
KETONES UR: NEGATIVE mg/dL
Leukocytes, UA: NEGATIVE
NITRITE: NEGATIVE
PH: 6 (ref 5.0–8.0)
Protein, ur: NEGATIVE mg/dL
Specific Gravity, Urine: 1.025 (ref 1.005–1.030)
Urobilinogen, UA: 0.2 mg/dL (ref 0.0–1.0)

## 2017-08-01 MED ORDER — AZITHROMYCIN 250 MG PO TABS
ORAL_TABLET | ORAL | Status: AC
  Filled 2017-08-01: qty 4

## 2017-08-01 MED ORDER — CEFTRIAXONE SODIUM 250 MG IJ SOLR
250.0000 mg | Freq: Once | INTRAMUSCULAR | Status: AC
  Administered 2017-08-01: 250 mg via INTRAMUSCULAR

## 2017-08-01 MED ORDER — CEFTRIAXONE SODIUM 250 MG IJ SOLR
INTRAMUSCULAR | Status: AC
  Filled 2017-08-01: qty 250

## 2017-08-01 MED ORDER — AZITHROMYCIN 250 MG PO TABS
1000.0000 mg | ORAL_TABLET | Freq: Once | ORAL | Status: AC
  Administered 2017-08-01: 1000 mg via ORAL

## 2017-08-01 NOTE — Discharge Instructions (Signed)
Please avoid any sexual activity until symptoms have resolved.  Return to the urgent care facility for any worsening changes in your health.

## 2017-08-01 NOTE — ED Triage Notes (Signed)
PT reports dysuria for 1 day, he is concerned about STD exposure

## 2017-08-01 NOTE — ED Provider Notes (Signed)
[facility]    CSN: [id number] Arrival date & time: 08/01/17  1742     History   Chief Complaint Chief Complaint  Patient presents with  . Exposure to STD    HPI Timothy Harrington is a 52 y.o. male presents to the urgent care facility for evaluation of possible exposure to STD.  Patient states he has had burning with urination for 1 day.  He denies any penile discharge, testicular pain, swelling.  He has had intercourse with new sexual partner.  He denies any difficulty with flow, back pain, abdominal pain, nausea vomiting or fevers.   HPI  Past Medical History:  Diagnosis Date  . Kidney stones     Patient Active Problem List   Diagnosis Date Noted  . Diverticulitis 04/21/2016    Past Surgical History:  Procedure Laterality Date  . KIDNEY STONE SURGERY         Home Medications    Prior to Admission medications   Medication Sig Start Date End Date Taking? Authorizing Provider  oxyCODONE (ROXICODONE) 5 MG immediate release tablet Take 1 tablet (5 mg total) by mouth every 8 (eight) hours as needed. 04/24/16   Meuth, Lina SarBrooke A, PA-C  saccharomyces boulardii (FLORASTOR) 250 MG capsule Take 1 capsule (250 mg total) by mouth 2 (two) times daily. You can get a probiotic over the counter. 04/24/16   Meuth, Lina SarBrooke A, PA-C    Family History Family History  Problem Relation Age of Onset  . Cancer Neg Hx   . Hypertension Neg Hx     Social History Social History   Tobacco Use  . Smoking status: Current Every Day Smoker    Packs/day: 0.10    Types: Cigarettes  . Smokeless tobacco: Never Used  Substance Use Topics  . Alcohol use: Yes    Comment: socially  . Drug use: No    Comment: Marijuana hx     Allergies   Patient has no known allergies.   Review of Systems Review of Systems  Constitutional: Negative for fever.  Respiratory: Negative for shortness of breath.   Cardiovascular: Negative for chest pain.  Gastrointestinal: Negative for abdominal  pain.  Genitourinary: Positive for dysuria. Negative for decreased urine volume, difficulty urinating, discharge, flank pain, penile pain, penile swelling, scrotal swelling, testicular pain and urgency.  Musculoskeletal: Negative for back pain and myalgias.  Skin: Negative for rash.  Neurological: Negative for dizziness and headaches.     Physical Exam Triage Vital Signs ED Triage Vitals  Enc Vitals Group     BP 08/01/17 1824 (!) 147/103     Pulse Rate 08/01/17 1824 92     Resp 08/01/17 1824 16     Temp 08/01/17 1824 98.7 F (37.1 C)     Temp Source 08/01/17 1824 Oral     SpO2 08/01/17 1824 98 %     Weight 08/01/17 1825 190 lb (86.2 kg)     Height --      Head Circumference --      Peak Flow --      Pain Score 08/01/17 1825 3     Pain Loc --      Pain Edu? --      Excl. in GC? --    No data found.  Updated Vital Signs BP (!) 147/103   Pulse 92   Temp 98.7 F (37.1 C) (Oral)   Resp 16   Wt 190 lb (86.2 kg)   SpO2 98%   BMI 27.26  kg/m   Visual Acuity Right Eye Distance:   Left Eye Distance:   Bilateral Distance:    Right Eye Near:   Left Eye Near:    Bilateral Near:     Physical Exam  Constitutional: He is oriented to person, place, and time. He appears well-developed and well-nourished.  HENT:  Head: Normocephalic and atraumatic.  Eyes: Conjunctivae are normal.  Neck: Normal range of motion.  Cardiovascular: Normal rate.  Pulmonary/Chest: Effort normal. No respiratory distress.  Abdominal: Soft. There is no tenderness.  Genitourinary: Penis normal.  Genitourinary Comments: No testicular tenderness.  Musculoskeletal: Normal range of motion.  Neurological: He is alert and oriented to person, place, and time.  Skin: Skin is warm. No rash noted.  Psychiatric: He has a normal mood and affect. His behavior is normal. Thought content normal.     UC Treatments / Results  Labs (all labs ordered are listed, but only abnormal results are displayed) Labs  Reviewed  POCT URINALYSIS DIP (DEVICE) - Abnormal; Notable for the following components:      Result Value   Hgb urine dipstick TRACE (*)    All other components within normal limits  URINALYSIS, DIPSTICK ONLY  URINE CYTOLOGY ANCILLARY ONLY    EKG  EKG Interpretation None       Radiology No results found.  Procedures Procedures (including critical care time)  Medications Ordered in UC Medications  cefTRIAXone (ROCEPHIN) injection 250 mg (not administered)  azithromycin (ZITHROMAX) tablet 1,000 mg (not administered)     Initial Impression / Assessment and Plan / UC Course  I have reviewed the triage vital signs and the nursing notes.  Pertinent labs & imaging results that were available during my care of the patient were reviewed by me and considered in my medical decision making (see chart for details).     52 year old male with dysuria.  No evidence of UTI.  Patient concern for possible STD.  STD results are pending, patient elected to proceed with treatment for gonorrhea chlamydia with ceftriaxone and azithromycin.  Final Clinical Impressions(s) / UC Diagnoses   Final diagnoses:  Urethritis  Concern about STD in male without diagnosis    ED Discharge Orders    None        Evon Slack, [place] 08/01/17 1926

## 2017-08-02 LAB — URINE CYTOLOGY ANCILLARY ONLY
CHLAMYDIA, DNA PROBE: NEGATIVE
NEISSERIA GONORRHEA: NEGATIVE
Trichomonas: NEGATIVE

## 2017-08-22 IMAGING — CT CT ABD-PELV W/ CM
2 of 5 series · 10 of 46 positions shown, 11 images · IV contrast (Iodine)
Comparison: Report 10/29/2001 no images available

CLINICAL DATA: Lower abdominal pain, leukocytosis, suprapubic pain

EXAM:
CT ABDOMEN AND PELVIS WITH CONTRAST
TECHNIQUE: Multidetector CT imaging of the abdomen and pelvis was performed
using the standard protocol following bolus administration of
intravenous contrast.
CONTRAST:  100mL ZMP5TN-VWW IOPAMIDOL (ZMP5TN-VWW) INJECTION 61%

[Series 201: routine, idose (2) · axial · 0.70mm/px · z∈[-739,-374]mm · 7 of 95 slices shown, 8 images]
[im 11/95  soft-tissue]
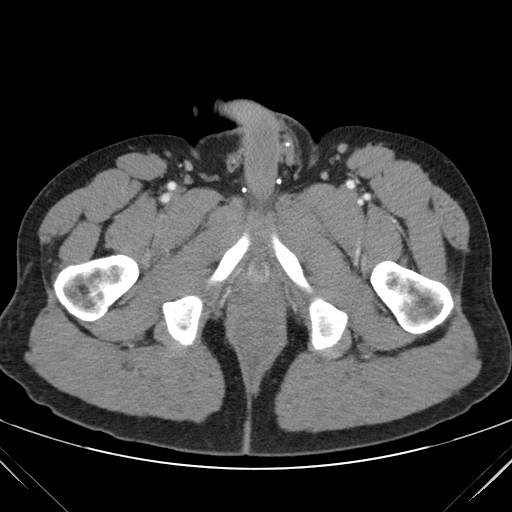
[im 11/95  bone]
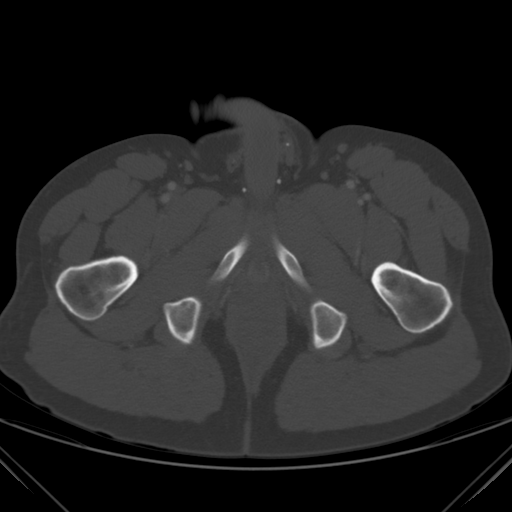
[im 21/95  soft-tissue]
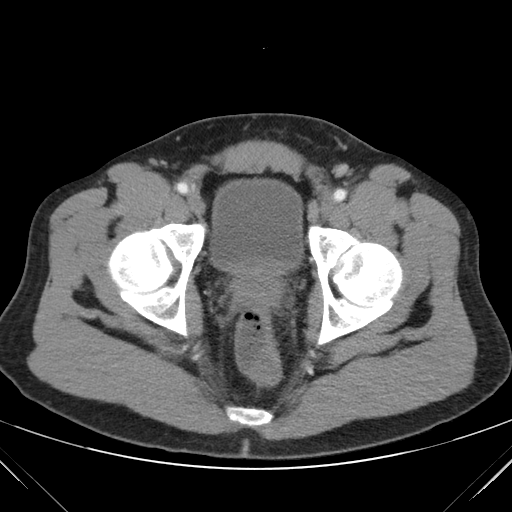
[im 37/95  soft-tissue]
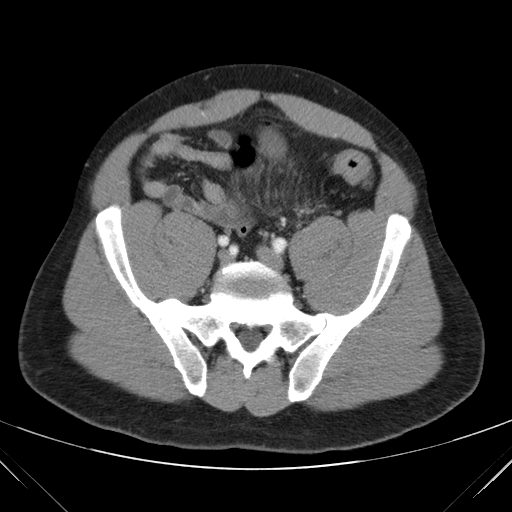
[im 48/95  soft-tissue]
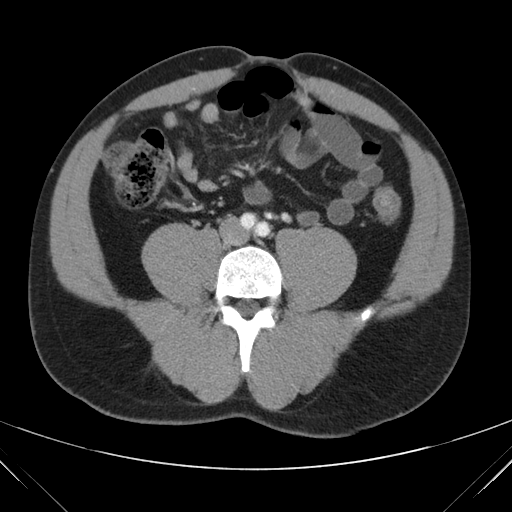
[im 58/95  soft-tissue]
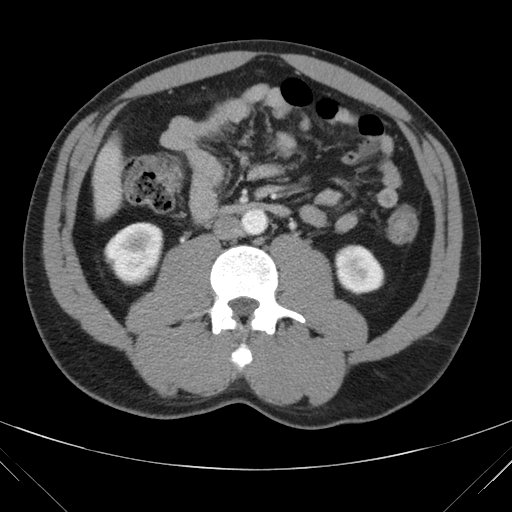
[im 74/95  soft-tissue]
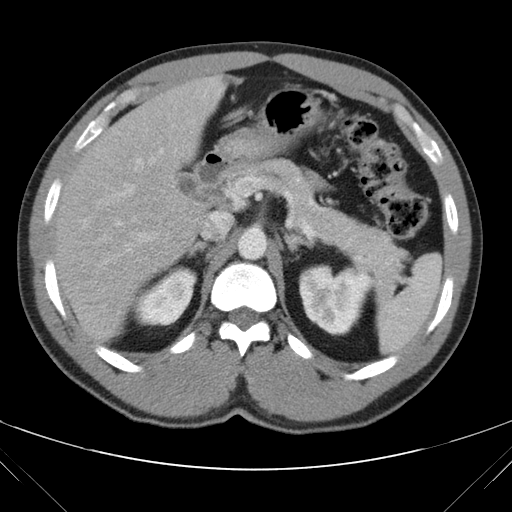
[im 84/95  soft-tissue]
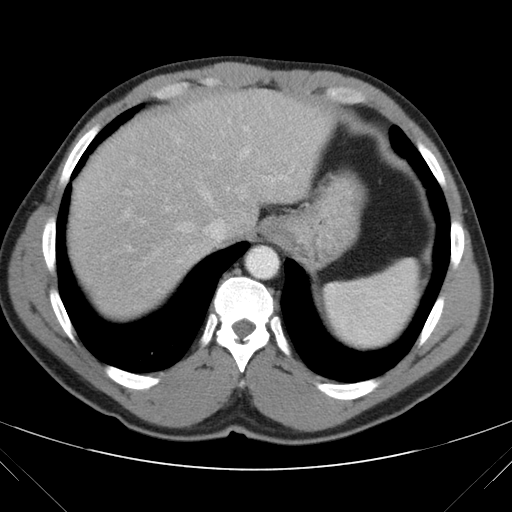

[Series 203: coronals, idose (2) · coronal · 0.45mm/px · 3 of 143 slices shown]
[im 48/143  soft-tissue]
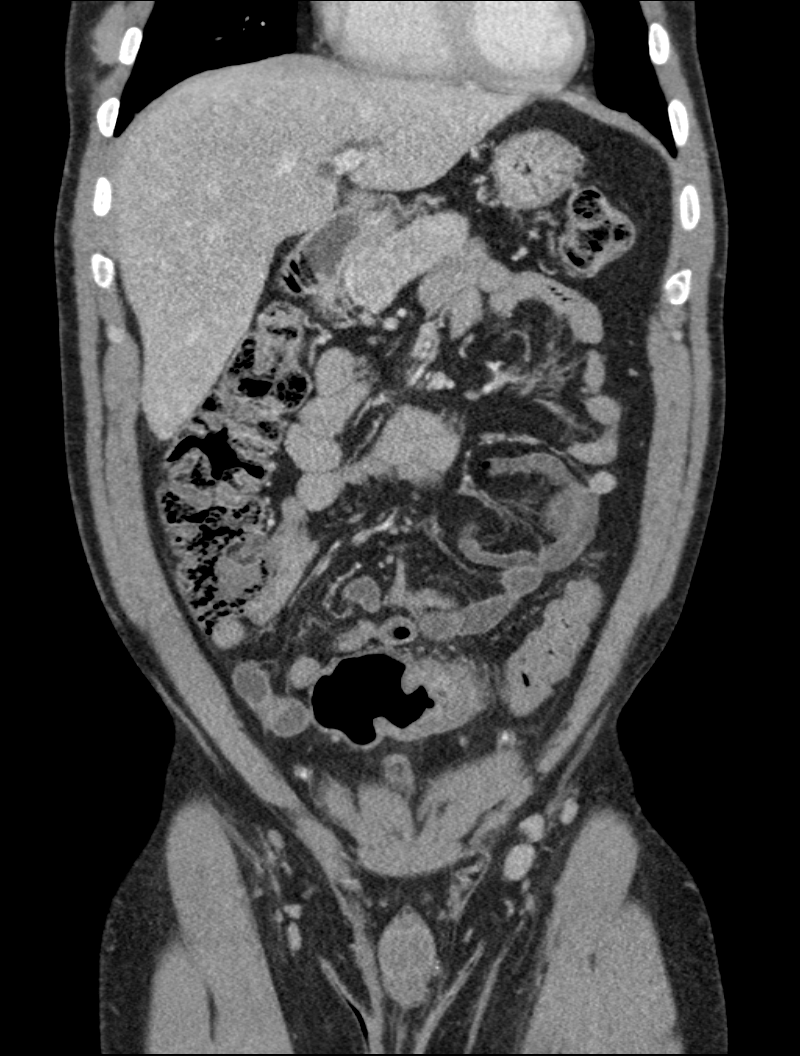
[im 64/143  soft-tissue]
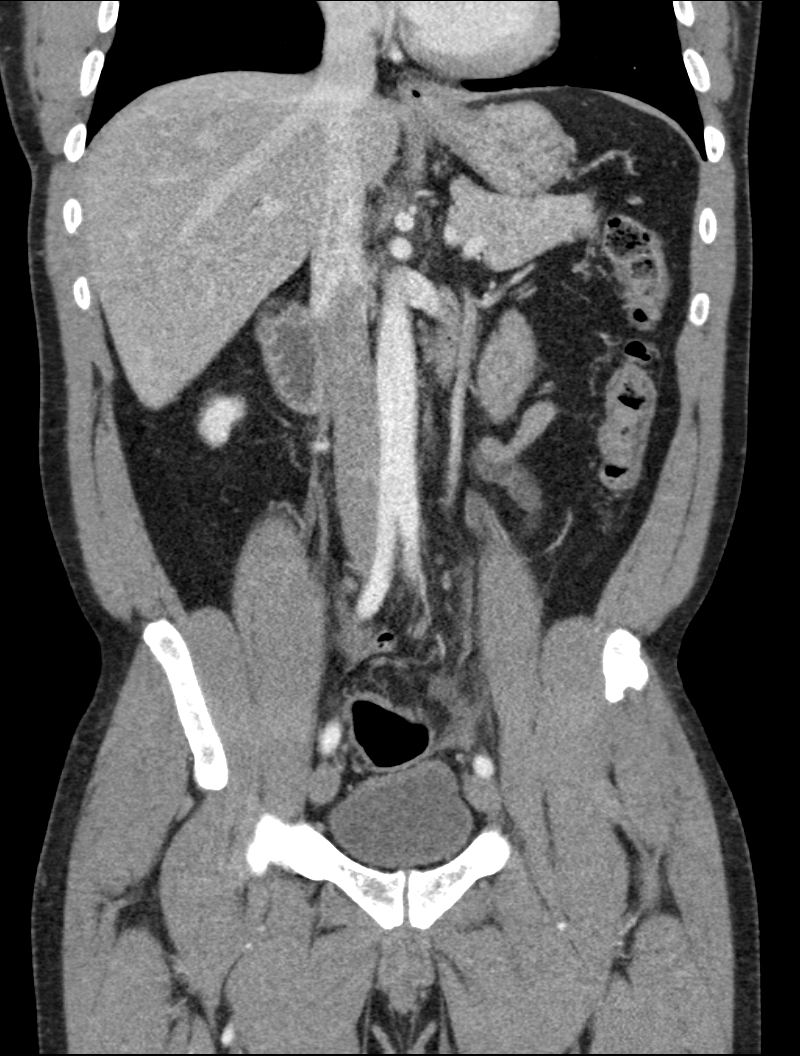
[im 79/143  soft-tissue]
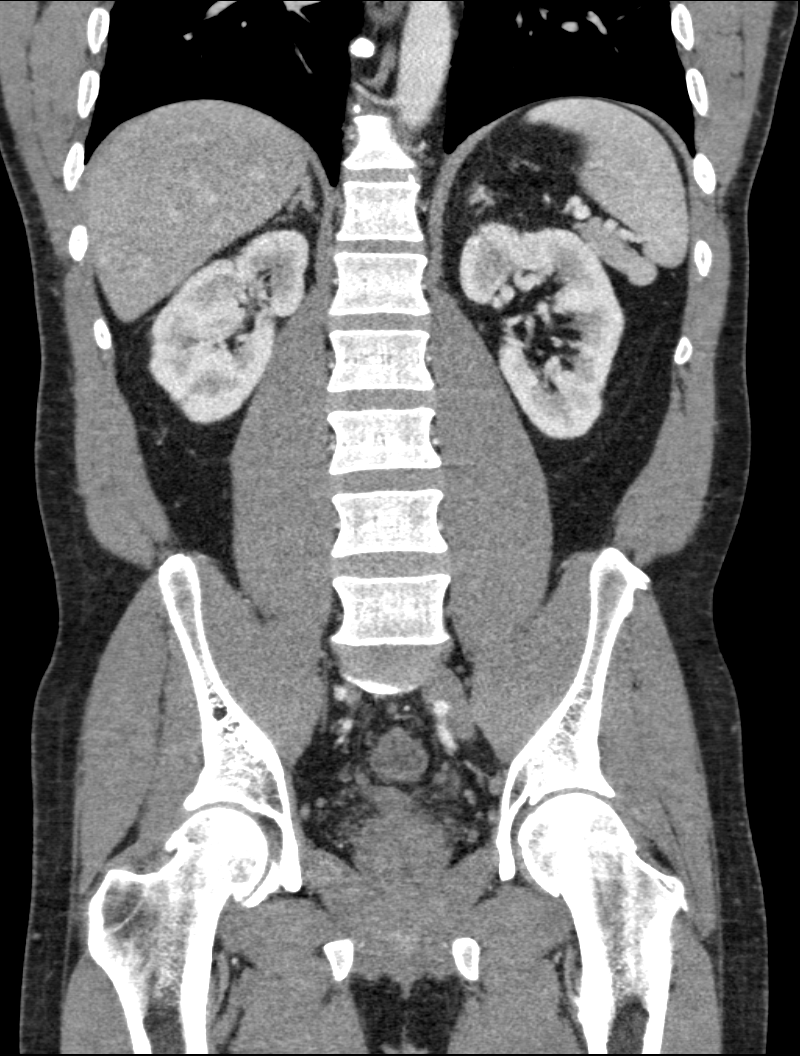

[10 of 46 positions shown; findings below may reference images not displayed]

FINDINGS: Lower chest: Lung bases are unremarkable. Mild thickening of distal
esophageal wall. Clinical correlation is necessary to exclude
gastroesophageal reflux.

Hepatobiliary: The gallbladder is not identified. Mild intrahepatic
biliary ductal dilatation. Mild fatty infiltration of the liver.

Pancreas: Enhanced pancreas is unremarkable.

Spleen: Enhanced spleen is unremarkable.

Adrenals/Urinary Tract: No adrenal gland mass. Enhanced kidneys are
symmetrical in size. No hydronephrosis or hydroureter. Delayed renal
images shows bilateral renal symmetrical excretion. Bilateral
visualized proximal ureter is unremarkable.

Stomach/Bowel: No gastric outlet obstruction. Mild dilated small
bowel loops with some air-fluid levels in lower abdomen suspicious
for ileus. Less likely enteritis. No small bowel obstruction. Normal
appendix containing air noted in axial image 53. No pericecal
inflammation. Scattered diverticula are noted descending colon. Few
diverticula are noted proximal sigmoid colon. Axial image 64 there
is short segment thickening of colonic wall in proximal sigmoid
colon. There is stranding of surrounding fat. Small amount of focal
pericolonic air is noted in axial image 65. This is confirmed in
coronal image 57. Findings are highly suspicious for perforated
contained acute diverticulitis. No evidence of free abdominal air in
upper abdomen.

Vascular/Lymphatic: No aortic aneurysm. No retroperitoneal or
mesenteric adenopathy.

Reproductive: Prostate gland and seminal vesicles are unremarkable.

Other: No abdominal ascites is noted. There is focal skin thickening
and small amount of subcutaneous fluid in left lower anterior
abdominal wall please see axial image 69. Findings suspicious for
cellulitis. Tiny subcutaneous abscess cannot be excluded. Clinical
correlation is necessary.

Musculoskeletal: No destructive bony lesions are noted. Sagittal
images of the spine shows degenerative changes lower thoracic spine.
Small nonspecific bilateral inguinal lymph nodes are noted.
IMPRESSION: 1. Mild distended small bowel loops with some fluid and air-fluid
level suspicious for ileus or enteritis. Less likely small bowel
obstruction.
2. Axial image 64 there is short segment thickening of colonic wall
in proximal sigmoid colon. There is stranding of surrounding fat.
Small amount of focal pericolonic air is noted in axial image 65.
This is confirmed in coronal image 57. Findings are highly
suspicious for perforated contained acute diverticulitis.
3. No evidence of free abdominal air in upper abdomen.
4. Normal appendix.  No pericecal inflammation.
5. No hydronephrosis or hydroureter.
6. There is focal skin thickening and small amount of subcutaneous
fluid in left lower anterior abdominal wall please see axial image
69. Findings suspicious for cellulitis. Tiny subcutaneous abscess
cannot be excluded. Clinical correlation is necessary.

These results were called by telephone at the time of interpretation
on 04/21/2016 at [DATE] to Dr. JEVAN TIGER , who verbally
acknowledged these results.

## 2022-03-18 ENCOUNTER — Emergency Department (HOSPITAL_COMMUNITY)
Admission: EM | Admit: 2022-03-18 | Discharge: 2022-03-19 | Disposition: A | Discharge status: Home / Self Care | Payer: 59 | Attending: Emergency Medicine | Admitting: Emergency Medicine

## 2022-03-18 ENCOUNTER — Emergency Department (HOSPITAL_COMMUNITY): Payer: 59

## 2022-03-18 ENCOUNTER — Encounter (HOSPITAL_COMMUNITY): Payer: Self-pay | Admitting: *Deleted

## 2022-03-18 ENCOUNTER — Other Ambulatory Visit: Payer: Self-pay

## 2022-03-18 DIAGNOSIS — M79602 Pain in left arm: Secondary | ICD-10-CM | POA: Diagnosis not present

## 2022-03-18 DIAGNOSIS — R519 Headache, unspecified: Secondary | ICD-10-CM | POA: Diagnosis not present

## 2022-03-18 DIAGNOSIS — I10 Essential (primary) hypertension: Secondary | ICD-10-CM | POA: Diagnosis not present

## 2022-03-18 DIAGNOSIS — R0789 Other chest pain: Secondary | ICD-10-CM | POA: Diagnosis not present

## 2022-03-18 HISTORY — DX: Essential (primary) hypertension: I10

## 2022-03-18 LAB — CBC
HCT: 43 % (ref 39.0–52.0)
Hemoglobin: 15.5 g/dL (ref 13.0–17.0)
MCH: 29.1 pg (ref 26.0–34.0)
MCHC: 36 g/dL (ref 30.0–36.0)
MCV: 80.7 fL (ref 80.0–100.0)
Platelets: 214 10*3/uL (ref 150–400)
RBC: 5.33 MIL/uL (ref 4.22–5.81)
RDW: 13 % (ref 11.5–15.5)
WBC: 7.5 10*3/uL (ref 4.0–10.5)
nRBC: 0 % (ref 0.0–0.2)

## 2022-03-18 LAB — URINALYSIS, ROUTINE W REFLEX MICROSCOPIC
Bacteria, UA: NONE SEEN
Bilirubin Urine: NEGATIVE
Glucose, UA: NEGATIVE mg/dL
Ketones, ur: NEGATIVE mg/dL
Leukocytes,Ua: NEGATIVE
Nitrite: NEGATIVE
Protein, ur: NEGATIVE mg/dL
Specific Gravity, Urine: 1.02 (ref 1.005–1.030)
pH: 5 (ref 5.0–8.0)

## 2022-03-18 LAB — BASIC METABOLIC PANEL
Anion gap: 11 (ref 5–15)
BUN: 11 mg/dL (ref 6–20)
CO2: 23 mmol/L (ref 22–32)
Calcium: 9.6 mg/dL (ref 8.9–10.3)
Chloride: 103 mmol/L (ref 98–111)
Creatinine, Ser: 1.37 mg/dL — ABNORMAL HIGH (ref 0.61–1.24)
GFR, Estimated: 60 mL/min — ABNORMAL LOW (ref >60)
Glucose, Bld: 90 mg/dL (ref 70–99)
Potassium: 3.8 mmol/L (ref 3.5–5.1)
Sodium: 137 mmol/L (ref 135–145)

## 2022-03-18 LAB — TROPONIN I (HIGH SENSITIVITY)
Troponin I (High Sensitivity): 6 ng/L (ref <18)
Troponin I (High Sensitivity): 6 ng/L (ref <18)

## 2022-03-18 NOTE — ED Provider Triage Note (Signed)
Emergency Medicine Provider Triage Evaluation Note  Timothy Harrington , a 56 y.o. male  was evaluated in triage.  Pt complains of left arm pain onset 1 week. Also notes intermittent central chest pain and shortness of breath.  Also some concern for elevated blood pressure.  Also notes numbness in bilateral fingers and toes. Denies history of MI, cardiac catheterization, stents.  Denies urinary symptoms, double vision.  Review of Systems  Positive: As per HPI Negative:   Physical Exam  BP (!) 174/123 (BP Location: Right Arm)   Pulse 88   Temp 98.6 F (37 C) (Oral)   Resp 16   SpO2 98%  Gen:   Awake, no distress   Resp:  Normal effort  MSK:   Moves extremities without difficulty  Other:  No focal neurological deficits on exam.  Negative pronator drift.  Able to ambulate without assistance or difficulty.  Grip strength 5/5 bilaterally.  Strength sensation intact to bilateral upper and lower extremities.  Mild left-sided chest wall tenderness to palpation.  Medical Decision Making  Medically screening exam initiated at 7:00 PM.  Appropriate orders placed.  Timothy Harrington was informed that the remainder of the evaluation will be completed by another provider, this initial triage assessment does not replace that evaluation, and the importance of remaining in the ED until their evaluation is complete.  7:00 PM -notified by tech that attending discussed patient's EKG with cardiologist, Dr. Shirlee Latch who notes that it appears to be LVH at this time.   Carlito Bogert A, PA-C 03/18/22 1913

## 2022-03-18 NOTE — ED Triage Notes (Addendum)
Pt states left arm pain for about a week and intermittent central chest pain, numbness in his fingers and toes, and reports shortness of breath "more than usual". Went to the pharmacy today, said his blood pressure was elevated, says he was dx with HTN about 2 years ago and has not started on medications.

## 2022-03-19 MED ORDER — KETOROLAC TROMETHAMINE 15 MG/ML IJ SOLN
15.0000 mg | Freq: Once | INTRAMUSCULAR | Status: AC
  Administered 2022-03-19: 15 mg via INTRAMUSCULAR
  Filled 2022-03-19: qty 1

## 2022-03-19 MED ORDER — ACETAMINOPHEN 500 MG PO TABS
1000.0000 mg | ORAL_TABLET | Freq: Once | ORAL | Status: AC
  Administered 2022-03-19: 1000 mg via ORAL
  Filled 2022-03-19: qty 2

## 2022-03-19 MED ORDER — OXYCODONE HCL 5 MG PO TABS
5.0000 mg | ORAL_TABLET | Freq: Once | ORAL | Status: AC
  Administered 2022-03-19: 5 mg via ORAL
  Filled 2022-03-19: qty 1

## 2022-03-19 MED ORDER — MORPHINE SULFATE 15 MG PO TABS: 7.5000 mg | ORAL_TABLET | ORAL | 0 refills | Status: DC | PRN

## 2022-03-19 NOTE — Discharge Instructions (Addendum)

## 2022-03-19 NOTE — ED Provider Notes (Signed)
[name] EMERGENCY DEPARTMENT Provider Note   CSN: [id number] Arrival date & time: 03/18/22  1825     History  Chief Complaint  Patient presents with   Hypertension    Timothy Harrington is a 56 y.o. male.  56 yo M with a chief complaint of left arm pain.  This has been going on for about 3 days now.  Described it as sharp and shooting seems to come from the upper arm down to about the elbow.  Mostly on the medial aspect of the arm.  He denies any trauma to the area.  Seems to be worse when he sits or lies down.  He denies any neck pain denies trauma to the neck.  Sometimes feels like it shoots down to his hand.  He developed some mild chest discomfort earlier today that lasted for less than an hour and resolve spontaneously.  Not exertional.  He tells me he is more worried about his arm pain and seems to focus on that.   Hypertension       Home Medications Prior to Admission medications   Medication Sig Start Date End Date Taking? Authorizing Provider  morphine (MSIR) 15 MG tablet Take 0.5 tablets (7.5 mg total) by mouth every 4 (four) hours as needed for severe pain. 03/19/22  Yes Melene Plan, DO  oxyCODONE (ROXICODONE) 5 MG immediate release tablet Take 1 tablet (5 mg total) by mouth every 8 (eight) hours as needed. 04/24/16   Meuth, Lina Sar, PA-C  saccharomyces boulardii (FLORASTOR) 250 MG capsule Take 1 capsule (250 mg total) by mouth 2 (two) times daily. You can get a probiotic over the counter. 04/24/16   Meuth, Lina Sar, PA-C      Allergies    Patient has no known allergies.    Review of Systems   Review of Systems  Physical Exam Updated Vital Signs BP (!) 154/108 (BP Location: Right Arm)   Pulse 66   Temp 98.4 F (36.9 C) (Oral)   Resp (!) 21   Ht 5\' 10"  (1.778 m)   Wt 83.9 kg   SpO2 100%   BMI 26.54 kg/m  Physical Exam Vitals and nursing note reviewed.  Constitutional:      Appearance: He is well-developed.  HENT:     Head:  Normocephalic and atraumatic.  Eyes:     Pupils: Pupils are equal, round, and reactive to light.  Neck:     Vascular: No JVD.  Cardiovascular:     Rate and Rhythm: Normal rate and regular rhythm.     Heart sounds: No murmur heard.    No friction rub. No gallop.  Pulmonary:     Effort: No respiratory distress.     Breath sounds: No wheezing.  Abdominal:     General: There is no distension.     Tenderness: There is no abdominal tenderness. There is no guarding or rebound.  Musculoskeletal:        General: Normal range of motion.     Cervical back: Normal range of motion and neck supple.     Comments: No obvious pain to the left arm on shoulder on exam.  Full ROM of the L shoulder without pain. PMS intact distally.  No midline C-spine tenderness.  Negative Spurling's.  Skin:    Coloration: Skin is not pale.     Findings: No rash.  Neurological:     Mental Status: He is alert and oriented to person, place, and time.  Psychiatric:  Behavior: Behavior normal.     ED Results / Procedures / Treatments   Labs (all labs ordered are listed, but only abnormal results are displayed) Labs Reviewed  BASIC METABOLIC PANEL - Abnormal; Notable for the following components:      Result Value   Creatinine, Ser 1.37 (*)    GFR, Estimated 60 (*)    All other components within normal limits  URINALYSIS, ROUTINE W REFLEX MICROSCOPIC - Abnormal; Notable for the following components:   Hgb urine dipstick SMALL (*)    All other components within normal limits  CBC  TROPONIN I (HIGH SENSITIVITY)  TROPONIN I (HIGH SENSITIVITY)    EKG EKG Interpretation  Date/Time:  Friday March 18 2022 18:52:31 EDT Ventricular Rate:  76 PR Interval:  158 QRS Duration: 80 QT Interval:  346 QTC Calculation: 389 R Axis:   55 Text Interpretation: Normal sinus rhythm Minimal voltage criteria for LVH, may be normal variant ( Sokolow-Lyon ) Nonspecific T wave abnormality Abnormal ECG No significant change  since last tracing Confirmed by Deno Etienne [phone]) on 03/19/2022 1:42:01 AM  Radiology CT Head Wo Contrast  Result Date: 03/18/2022 CLINICAL DATA:  Headaches, blurred vision EXAM: CT HEAD WITHOUT CONTRAST TECHNIQUE: Contiguous axial images were obtained from the base of the skull through the vertex without intravenous contrast. RADIATION DOSE REDUCTION: This exam was performed according to the departmental dose-optimization program which includes automated exposure control, adjustment of the mA and/or kV according to patient size and/or use of iterative reconstruction technique. COMPARISON:  01/11/2007 FINDINGS: Brain: No acute intracranial findings are seen. Beam hardening artifacts due to ear ring limits evaluation. There are no signs of bleeding within the cranium. Ventricles are not dilated. There is no focal edema or mass effect. Vascular: Unremarkable. Skull: Unremarkable. Sinuses/Orbits: Unremarkable. There is metallic density lateral to the left orbit, possibly skin piercing metallic structure. Other: None. IMPRESSION: No acute intracranial findings are seen in noncontrast CT brain. Electronically Signed   By: Elmer Picker M.D.   On: 03/18/2022 20:41   DG Chest 1 View  Result Date: 03/18/2022 CLINICAL DATA:  Hypertension. EXAM: CHEST  1 VIEW COMPARISON:  Chest x-ray 03/15/2013 FINDINGS: The heart size and mediastinal contours are within normal limits. Both lungs are clear. The visualized skeletal structures are unremarkable. IMPRESSION: No active disease. Electronically Signed   By: Ronney Asters M.D.   On: 03/18/2022 19:56    Procedures Procedures    Medications Ordered in ED Medications  acetaminophen (TYLENOL) tablet 1,000 mg (1,000 mg Oral Given 03/19/22 0242)  ketorolac (TORADOL) 15 MG/ML injection 15 mg (15 mg Intramuscular Given 03/19/22 0243)  oxyCODONE (Oxy IR/ROXICODONE) immediate release tablet 5 mg (5 mg Oral Given 03/19/22 0243)    ED Course/ Medical Decision Making/  A&P                           Medical Decision Making Risk OTC drugs. Prescription drug management.   56 yo M with a chief complaints of left arm pain.  Clinically this seems most likely to be a radiculopathy.  He does not endorse any specific neck pain.  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  He has full range of motion of the shoulder without pain making muscular etiology less likely.  He had some chest pain earlier, he denies any specific concern for  this, denies any exertional symptoms no shortness of breath with it.  Had a work-up performed in the MSE process with 2 negative troponins no significant electrolyte abnormality no anemia chest x-ray independently interpreted by me without focal infiltrate or pneumothorax.  We will have him treat his pain with Tylenol and NSAIDs.  Short course of narcotics.  Referral to neurology for possible EMG.  4:50 AM:  I have discussed the diagnosis/risks/treatment options with the patient and family.  Evaluation and diagnostic testing in the emergency department does not suggest an emergent condition requiring admission or immediate intervention beyond what has been performed at this time.  They will follow up with  PCP, neuro. We also discussed returning to the ED immediately if new or worsening sx occur. We discussed the sx which are most concerning (e.g., sudden worsening pain, fever, inability to tolerate by mouth) that necessitate immediate return. Medications administered to the patient during their visit and any new prescriptions provided to the patient are listed below.  Medications given during this visit Medications  acetaminophen (TYLENOL) tablet 1,000 mg (1,000 mg Oral Given 03/19/22 0242)  ketorolac (TORADOL) 15 MG/ML injection 15 mg (15 mg Intramuscular Given 03/19/22 0243)  oxyCODONE (Oxy IR/ROXICODONE) immediate release tablet 5 mg (5 mg Oral Given  03/19/22 0243)     The patient appears reasonably screen and/or stabilized for discharge and I doubt any other medical condition or other [facility] requiring further screening, evaluation, or treatment in the ED at this time prior to discharge.          Final Clinical Impression(s) / ED Diagnoses Final diagnoses:  Left arm pain    Rx / DC Orders ED Discharge Orders          Ordered    Ambulatory referral to Neurology       Comments: Cervical neuropathy?   03/19/22 0208    morphine (MSIR) 15 MG tablet  Every 4 hours PRN        03/19/22 Gertie Fey, DO 03/19/22 [phone]

## 2022-03-31 ENCOUNTER — Encounter: Payer: Self-pay | Admitting: Neurology

## 2022-03-31 ENCOUNTER — Ambulatory Visit (INDEPENDENT_AMBULATORY_CARE_PROVIDER_SITE_OTHER): Payer: 59 | Admitting: Neurology

## 2022-03-31 ENCOUNTER — Telehealth: Payer: Self-pay | Admitting: Neurology

## 2022-03-31 VITALS — BP 145/90 | HR 70 | Ht 70.0 in | Wt 193.0 lb

## 2022-03-31 DIAGNOSIS — M5412 Radiculopathy, cervical region: Secondary | ICD-10-CM | POA: Diagnosis not present

## 2022-03-31 DIAGNOSIS — R292 Abnormal reflex: Secondary | ICD-10-CM

## 2022-03-31 DIAGNOSIS — R260 Ataxic gait: Secondary | ICD-10-CM

## 2022-03-31 NOTE — Progress Notes (Signed)
[facility]  PATIENT: Timothy Harrington DOB: [birth year]  REFERRING DOCTOR OR PCP:   Melene Plan, DO SOURCE: Patient, notes from  ED, imaging and lab reports CT scan images personally reviewed.  _________________________________   HISTORICAL  CHIEF COMPLAINT:  Chief Complaint  Patient presents with   New Patient (Initial Visit)    RM 1. 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     HISTORY OF PRESENT ILLNESS:  I had the pleasure of seeing your patient, Timothy Harrington, at [facility] for neurologic consultation regarding his left arm pain.  He is a 56 year old man who has had neck pain and left arm pain x several weeks.Marland Kitchen   He cannot associate any activity with the onset of symptoms.  Pain goes down arm over the deltoid to the forearm and only rarely to the first 3 fingers.    Pain is worse when he lays down at night.   He denies weakness.   He feels his gait is mildly slowed and he also reports a recent fall down stairs but was able to quickly get back up.      Pain intensified a few days after onset and he also had elevated BP.   Therefore, he went to the ED to r/o heart related issues.  While there he had a head CT (normal).   Lab tests including troponon were essentially normal.    He was given toradol and oxycodone with improvement once an MI was ruled out.     He deneis any leg weakness but has mild chronic right hip pain.  He notes no major bladder changes though may have to urinate a little mor efrequently.        Imaging: CT scan of the head 03/18/2022 was normal.  He has no spine imaging over the last 10 years.  REVIEW OF SYSTEMS: Constitutional: No fevers, chills, sweats, or change in appetite Eyes: No visual changes, double vision, eye pain Ear, nose and throat: No hearing loss, ear pain, nasal congestion, sore throat Cardiovascular: No chest pain, palpitations Respiratory:  No shortness of breath at rest or with exertion.   No  wheezes GastrointestinaI: No nausea, vomiting, diarrhea, abdominal pain, fecal incontinence Genitourinary:  No dysuria, urinary retention or frequency.  No nocturia. Musculoskeletal:  No neck pain, back pain Integumentary: No rash, pruritus, skin lesions Neurological: as above Psychiatric: No depression at this time.  No anxiety Endocrine: No palpitations, diaphoresis, change in appetite, change in weigh or increased thirst Hematologic/Lymphatic:  No anemia, purpura, petechiae. Allergic/Immunologic: No itchy/runny eyes, nasal congestion, recent allergic reactions, rashes  ALLERGIES: No Known Allergies  HOME MEDICATIONS:  Current Outpatient Medications:    morphine (MSIR) 15 MG tablet, Take 0.5 tablets (7.5 mg total) by mouth every 4 (four) hours as needed for severe pain., Disp: 3 tablet, Rfl: 0   oxyCODONE (ROXICODONE) 5 MG immediate release tablet, Take 1 tablet (5 mg total) by mouth every 8 (eight) hours as needed., Disp: 20 tablet, Rfl: 0   saccharomyces boulardii (FLORASTOR) 250 MG capsule, Take 1 capsule (250 mg total) by mouth 2 (two) times daily. You can get a probiotic over the counter., Disp: , Rfl:   PAST MEDICAL HISTORY: Past Medical History:  Diagnosis Date   Hypertension    Kidney stones     PAST SURGICAL HISTORY: Past Surgical History:  Procedure Laterality Date   KIDNEY STONE SURGERY      FAMILY HISTORY: Family History  Problem Relation Age of Onset  Cancer Neg Hx    Hypertension Neg Hx     SOCIAL HISTORY:  Social History   Socioeconomic History   Marital status: Legally Separated    Spouse name: Not on file   Number of children: Not on file   Years of education: Not on file   Highest education level: Not on file  Occupational History   Not on file  Tobacco Use   Smoking status: Every Day    Packs/day: 0.10    Types: Cigarettes   Smokeless tobacco: Never  Substance and Sexual Activity   Alcohol use: Yes    Comment: socially   Drug use: No     Frequency: 3.0 times per week    Comment: Marijuana hx   Sexual activity: Yes    Birth control/protection: None  Other Topics Concern   Not on file  Social History Narrative   Not on file   Social Determinants of Health   Financial Resource Strain: Not on file  Food Insecurity: Not on file  Transportation Needs: Not on file  Physical Activity: Not on file  Stress: Not on file  Social Connections: Not on file  Intimate Partner Violence: Not on file     PHYSICAL EXAM  Vitals:   03/31/22 1446  Height: 5\' 10"  (1.778 m)    Body mass index is 26.54 kg/m.   General: The patient is well-developed and well-nourished and in no acute distress  HEENT:  Head is [place]/AT.  Sclera are anicteric.  Funduscopic exam shows normal optic discs and retinal vessels.  Neck: No carotid bruits are noted.  The neck is nontender.  Cardiovascular: The heart has a regular rate and rhythm with a normal S1 and S2. There were no murmurs, gallops or rubs.    Skin: Extremities are without rash or  edema.  Musculoskeletal:  Back is nontender  Neurologic Exam  Mental status: The patient is alert and oriented x 3 at the time of the examination. The patient has apparent normal recent and remote memory, with an apparently normal attention span and concentration ability.   Speech is normal.  Cranial nerves: Extraocular movements are full. Pupils are equal, round, and reactive to light and accomodation.  Visual fields are full.  Facial symmetry is present. There is good facial sensation to soft touch bilaterally.Facial strength is normal.  Trapezius and sternocleidomastoid strength is normal. No dysarthria is noted.  The tongue is midline, and the patient has symmetric elevation of the soft palate. No obvious hearing deficits are noted.  Motor:  Muscle bulk is normal.   Tone is normal. Strength is  5 / 5 in all 4 extremities.   Sensory: Sensory testing is intact to pinprick, soft touch and vibration  sensation in all 4 extremities.  Coordination: Cerebellar testing reveals good finger-nose-finger and heel-to-shin bilaterally.  Gait and station: Station is normal.   Gait is normal. Tandem gait is wide. Romberg is negative.   Reflexes: Deep tendon reflexes are symmetric and normal in arms but 3 at the knees with crossed adductors and 3 at ankles, no clonus.   Plantar responses are flexor.    DIAGNOSTIC DATA (LABS, IMAGING, TESTING) - I reviewed patient records, labs, notes, testing and imaging myself where available.  Lab Results  Component Value Date   WBC 7.5 03/18/2022   HGB 15.5 03/18/2022   HCT 43.0 03/18/2022   MCV 80.7 03/18/2022   PLT 214 03/18/2022      Component Value Date/Time   NA 137  03/18/2022 1950   K 3.8 03/18/2022 1950   CL 103 03/18/2022 1950   CO2 23 03/18/2022 1950   GLUCOSE 90 03/18/2022 1950   BUN 11 03/18/2022 1950   CREATININE 1.37 (H) 03/18/2022 1950   CALCIUM 9.6 03/18/2022 1950   PROT 7.5 04/21/2016 0616   ALBUMIN 3.9 04/21/2016 0616   AST 25 04/21/2016 0616   ALT 33 04/21/2016 0616   ALKPHOS 85 04/21/2016 0616   BILITOT 1.1 04/21/2016 0616   GFRNONAA 60 (L) 03/18/2022 1950   GFRAA 57 (L) 04/22/2016 0639       ASSESSMENT AND PLAN  Cervical radiculopathy - Plan: MR CERVICAL SPINE WO CONTRAST  Ataxic gait - Plan: MR CERVICAL SPINE WO CONTRAST  Hyperreflexia - Plan: MR CERVICAL SPINE WO CONTRAST  In summary, Timothy Harrington is a 56 year old man who has left arm pain.  Although this most likely is due to a cervical radiculopathy, he also has mild imbalance and even fell down the stairs last week.  Additionally, he has hyperreflexia at the knee.  Therefore, we need to check an MRI of the brain to make sure that there is not an intrinsic or extrinsic myelopathy that could cause both the pain and the other symptoms.  We discussed that he can take Tylenol or OTC NSAIDs for the pain.  He prefers not to take a steroid pack at this point.  He will  return to see me as needed based on the results of the studies.  We also discussed referral to neurosurgery if he does have compressive myelopathy or evidence of significant nerve root compression.  Thank you for asking me to see Timothy Harrington.  Please let me know if I can be of further assistance with him or other patients in the future.   Alanee Ting A. Felecia Shelling, MD, [facility] [id number], [id number] PM Certified in Neurology, Clinical Neurophysiology, Sleep Medicine and Neuroimaging  [facility] [address], [place], [place] [address] [phone]

## 2022-03-31 NOTE — Telephone Encounter (Signed)
[employer] sent to [facility] they obtain auth 

## 2022-04-11 ENCOUNTER — Ambulatory Visit
Admission: RE | Admit: 2022-04-11 | Discharge: 2022-04-11 | Disposition: A | Discharge status: Home / Self Care | Payer: 59 | Source: Ambulatory Visit | Attending: Neurology | Admitting: Neurology

## 2022-04-11 DIAGNOSIS — M5412 Radiculopathy, cervical region: Secondary | ICD-10-CM | POA: Diagnosis not present

## 2022-04-11 DIAGNOSIS — R260 Ataxic gait: Secondary | ICD-10-CM

## 2022-04-11 DIAGNOSIS — R292 Abnormal reflex: Secondary | ICD-10-CM

## 2022-04-18 ENCOUNTER — Ambulatory Visit
Admission: EM | Admit: 2022-04-18 | Discharge: 2022-04-18 | Disposition: A | Discharge status: Home / Self Care | Payer: 59 | Attending: Physician Assistant | Admitting: Physician Assistant

## 2022-04-18 DIAGNOSIS — I160 Hypertensive urgency: Secondary | ICD-10-CM

## 2022-04-18 MED ORDER — AMLODIPINE BESYLATE 5 MG PO TABS: 5.0000 mg | ORAL_TABLET | Freq: Every day | ORAL | 0 refills | Status: DC

## 2022-04-18 NOTE — ED Triage Notes (Signed)
Pt c/o HTN. Reports going to [facility] today and noticing his BP was elevated during the screening exam. States hx of HTN dx and tx. Thought he could removed medications to substitute with lifestyle changes. Associated headaches.

## 2022-04-18 NOTE — Discharge Instructions (Signed)
  Check BP daily and record.   Follow up with primary care within the next 2-3 weeks.

## 2022-04-19 NOTE — ED Provider Notes (Signed)
[facility]    CSN: [id number] Arrival date & time: 04/18/22  1736      History   Chief Complaint Chief Complaint  Patient presents with   Hypertension    HPI Timothy Harrington is a 56 y.o. male.   Patient here today for evaluation of elevated blood pressure that he noted in [employer] today. He has history of hypertension but has never taken medication- states he was trying to improve blood pressure with lifestyles. He has not had any chest pain. He did have some left sided arm pain but has been seen and had full work up that ruled out cardiac etiology. He does admit to some headaches.   The history is provided by the patient.  Hypertension Associated symptoms include headaches. Pertinent negatives include no chest pain and no shortness of breath.    Past Medical History:  Diagnosis Date   Hypertension    Kidney stones     Patient Active Problem List   Diagnosis Date Noted   Diverticulitis 04/21/2016    Past Surgical History:  Procedure Laterality Date   KIDNEY STONE SURGERY         Home Medications    Prior to Admission medications   Medication Sig Start Date End Date Taking? Authorizing Provider  amLODipine (NORVASC) 5 MG tablet Take 1 tablet (5 mg total) by mouth daily. 04/18/22  Yes Francene Finders, PA-C    Family History Family History  Problem Relation Age of Onset   Cancer Neg Hx    Hypertension Neg Hx     Social History Social History   Tobacco Use   Smoking status: Every Day    Packs/day: 0.10    Types: Cigarettes   Smokeless tobacco: Never   Tobacco comments:    2 cigs per day  Substance Use Topics   Alcohol use: Yes    Comment: 1-2 drinks per day   Drug use: No    Frequency: 3.0 times per week    Comment: Marijuana hx     Allergies   Patient has no known allergies.   Review of Systems Review of Systems  Constitutional:  Negative for chills and fever.  Eyes:  Negative for discharge and redness.  Respiratory:   Negative for shortness of breath.   Cardiovascular:  Negative for chest pain.  Neurological:  Positive for headaches.     Physical Exam Triage Vital Signs ED Triage Vitals  Enc Vitals Group     BP 04/18/22 1849 (!) 188/101     Pulse Rate 04/18/22 1849 74     Resp 04/18/22 1849 16     Temp 04/18/22 1849 98.9 F (37.2 C)     Temp Source 04/18/22 1849 Oral     SpO2 04/18/22 1849 97 %     Weight --      Height --      Head Circumference --      Peak Flow --      Pain Score 04/18/22 1850 5     Pain Loc --      Pain Edu? --      Excl. in [place]? --    No data found.  Updated Vital Signs BP (!) 177/123   Pulse 74   Temp 98.9 F (37.2 C) (Oral)   Resp 16   SpO2 97%      Physical Exam Vitals and nursing note reviewed.  Constitutional:      General: He is not in acute distress.  Appearance: Normal appearance. He is not ill-appearing.  HENT:     Head: Normocephalic and atraumatic.  Eyes:     Conjunctiva/sclera: Conjunctivae normal.  Cardiovascular:     Rate and Rhythm: Normal rate and regular rhythm.  Pulmonary:     Effort: Pulmonary effort is normal. No respiratory distress.  Neurological:     Mental Status: He is alert.  Psychiatric:        Mood and Affect: Mood normal.        Behavior: Behavior normal.      UC Treatments / Results  Labs (all labs ordered are listed, but only abnormal results are displayed) Labs Reviewed - No data to display  EKG   Radiology No results found.  Procedures Procedures (including critical care time)  Medications Ordered in UC Medications - No data to display  Initial Impression / Assessment and Plan / UC Course  I have reviewed the triage vital signs and the nursing notes.  Pertinent labs & imaging results that were available during my care of the patient were reviewed by me and considered in my medical decision making (see chart for details).    Will start amlodipine and recommended follow up with PCP in the next  few weeks. Encouraged sooner follow up with any further concerns.   Final Clinical Impressions(s) / UC Diagnoses   Final diagnoses:  Hypertensive urgency     Discharge Instructions       Check BP daily and record.   Follow up with primary care within the next 2-3 weeks.     ED Prescriptions     Medication Sig Dispense Auth. Provider   amLODipine (NORVASC) 5 MG tablet Take 1 tablet (5 mg total) by mouth daily. 30 tablet Tomi Bamberger, PA-C      PDMP not reviewed this encounter.   Tomi Bamberger, PA-C 04/19/22 [phone]

## 2022-04-27 ENCOUNTER — Ambulatory Visit: Payer: 59 | Admitting: Nurse Practitioner

## 2022-04-27 ENCOUNTER — Encounter: Payer: Self-pay | Admitting: Nurse Practitioner

## 2022-04-27 VITALS — BP 150/86 | HR 78 | Temp 97.3°F | Ht 70.0 in | Wt 191.2 lb

## 2022-04-27 DIAGNOSIS — N182 Chronic kidney disease, stage 2 (mild): Secondary | ICD-10-CM | POA: Insufficient documentation

## 2022-04-27 DIAGNOSIS — I10 Essential (primary) hypertension: Secondary | ICD-10-CM | POA: Diagnosis not present

## 2022-04-27 DIAGNOSIS — Z1211 Encounter for screening for malignant neoplasm of colon: Secondary | ICD-10-CM

## 2022-04-27 DIAGNOSIS — Z0000 Encounter for general adult medical examination without abnormal findings: Secondary | ICD-10-CM | POA: Diagnosis not present

## 2022-04-27 DIAGNOSIS — Z139 Encounter for screening, unspecified: Secondary | ICD-10-CM

## 2022-04-27 DIAGNOSIS — E663 Overweight: Secondary | ICD-10-CM

## 2022-04-27 DIAGNOSIS — F17200 Nicotine dependence, unspecified, uncomplicated: Secondary | ICD-10-CM | POA: Insufficient documentation

## 2022-04-27 DIAGNOSIS — F17210 Nicotine dependence, cigarettes, uncomplicated: Secondary | ICD-10-CM | POA: Diagnosis not present

## 2022-04-27 HISTORY — DX: Chronic kidney disease, stage 2 (mild): N18.2

## 2022-04-27 LAB — POCT URINALYSIS DIP (CLINITEK)
Bilirubin, UA: NEGATIVE
Blood, UA: NEGATIVE
Glucose, UA: NEGATIVE mg/dL
Ketones, POC UA: NEGATIVE mg/dL
Leukocytes, UA: NEGATIVE
Nitrite, UA: NEGATIVE
POC PROTEIN,UA: NEGATIVE
Spec Grav, UA: >=1.03 — AB (ref 1.010–1.025)
Urobilinogen, UA: 0.2 E.U./dL
pH, UA: 5.5 (ref 5.0–8.0)

## 2022-04-27 LAB — POCT GLYCOSYLATED HEMOGLOBIN (HGB A1C)
HbA1c POC (<> result, manual entry): 5.1 % (ref 4.0–5.6)
HbA1c, POC (controlled diabetic range): 5.1 % (ref 0.0–7.0)
HbA1c, POC (prediabetic range): 5.1 % — AB (ref 5.7–6.4)
Hemoglobin A1C: 5.1 % (ref 4.0–5.6)

## 2022-04-27 LAB — GLUCOSE, POCT (MANUAL RESULT ENTRY): POC Glucose: 110 mg/dl — AB (ref 70–99)

## 2022-04-27 MED ORDER — AMLODIPINE BESYLATE 5 MG PO TABS: 5.0000 mg | ORAL_TABLET | Freq: Every day | ORAL | 0 refills | Status: DC

## 2022-04-27 NOTE — Patient Instructions (Addendum)
Around 3 times per week, check your blood pressure 2 times per day. once in the morning and once in the evening. The readings should be at least one minute apart. Write down these values and bring them to your next nurse visit/appointment.  When you check your BP, make sure you have been doing something calm/relaxing 5 minutes prior to checking. Both feet should be flat on the floor and you should be sitting. Use your left arm and make sure it is in a relaxed position (on a table), and that the cuff is at the approximate level/height of your heart.   Please consider getting your shingles vaccin, TDAP vaccine and flu vaccine  Fasting labs 3-5 days before your next appointment    It is important that you exercise regularly at least 30 minutes 5 times a week as tolerated  Think about what you will eat, plan ahead. Choose " clean, green, fresh or frozen" over canned, processed or packaged foods which are more sugary, salty and fatty. 70 to 75% of food eaten should be vegetables and fruit. Three meals at set times with snacks allowed between meals, but they must be fruit or vegetables. Aim to eat over a 12 hour period , example 7 am to 7 pm, and STOP after  your last meal of the day. Drink water,generally about 64 ounces per day, no other drink is as healthy. Fruit juice is best enjoyed in a healthy way, by EATING the fruit.  Thanks for choosing [facility] we consider it a privelige to serve you.

## 2022-04-27 NOTE — Assessment & Plan Note (Signed)
Wt Readings from Last 3 Encounters:  04/27/22 191 lb 3.2 oz (86.7 kg)  03/31/22 193 lb (87.5 kg)  03/18/22 185 lb (83.9 kg)  Patient counseled on low-carb diet, need to engage in regular moderate exercises at least  150 minutes weekly discussed.  Benefits of healthy weight dicussed

## 2022-04-27 NOTE — Progress Notes (Addendum)
New Patient Office Visit  Subjective:  Patient ID: Timothy Harrington, male    DOB: Jan 20, 1966  Age: 56 y.o. MRN: [id number]  CC:  Chief Complaint  Patient presents with   Establish Care    Pt is here to establish care.    HPI Timothy Harrington is a 56 y.o. male with past medical history of hypertension, diverticulitis presents for establishing care for his chronic medical conditions. no previous PCP.   HTN. Started amlodipine 5mg  daily since about one week ago  Patient stated that he has been diagnosed with hypertension prior to his visit in the ED, he was prescribed medications but he felt he could control his hypertension with lifestyle changes only.patient denies denies CP, dizziness, SOB, edema.    Current everyday smoker .started smoking at age 65. Smoke 1-3 cigarettes daily.  Patient denies  shortness of breath, cough, wheezing  Due for flu vaccine, Tdap vaccine, shingles vaccine .need for all vaccines discussed today.  Patient Refused all vaccines due. Got 2 doses of covid vaccine , got them at church.   Past Medical History:  Diagnosis Date   Hypertension    Kidney stones     Past Surgical History:  Procedure Laterality Date   KIDNEY STONE SURGERY      Family History  Problem Relation Age of Onset   Stomach cancer Mother    Diabetes Mother    Kidney disease Father    Heart disease Sister    Cancer Neg Hx    Hypertension Neg Hx     Social History   Socioeconomic History   Marital status: Legally Separated    Spouse name: Not on file   Number of children: 3   Years of education: Not on file   Highest education level: Not on file  Occupational History   Not on file  Tobacco Use   Smoking status: Every Day    Packs/day: 0.10    Types: Cigarettes   Smokeless tobacco: Never   Tobacco comments:    2 cigs per day  Substance and Sexual Activity   Alcohol use: Yes    Comment: 1-2 drinks per day   Drug use: Yes    Frequency: 3.0 times per week    Comment:  Marijuana hx   Sexual activity: Yes    Birth control/protection: None  Other Topics Concern   Not on file  Social History Narrative   Right handed. Lives wit his daughter.    Social Determinants of Health   Financial Resource Strain: Not on file  Food Insecurity: Not on file  Transportation Needs: Not on file  Physical Activity: Not on file  Stress: Not on file  Social Connections: Not on file  Intimate Partner Violence: Not on file    ROS Review of Systems  Constitutional: Negative.  Negative for activity change, appetite change and chills.  HENT: Negative.  Negative for congestion, drooling, ear discharge and ear pain.   Respiratory: Negative.  Negative for apnea, cough and choking.   Cardiovascular: Negative.  Negative for chest pain, palpitations and leg swelling.  Endocrine: Negative.   Musculoskeletal: Negative.   Neurological: Negative.  Negative for dizziness, facial asymmetry and headaches.  Psychiatric/Behavioral: Negative.  Negative for agitation, behavioral problems and confusion.     Objective:   Today's Vitals: BP (!) 150/86   Pulse 78   Temp (!) 97.3 F (36.3 C)   Ht 5\' 10"  (1.778 m)   Wt 191 lb 3.2 oz (86.7 kg)  SpO2 100%   BMI 27.43 kg/m   Physical Exam Constitutional:      General: He is not in acute distress.    Appearance: Normal appearance. He is not ill-appearing, toxic-appearing or diaphoretic.  Eyes:     General:        Right eye: No discharge.        Left eye: No discharge.     Extraocular Movements: Extraocular movements intact.     Conjunctiva/sclera: Conjunctivae normal.     Pupils: Pupils are equal, round, and reactive to light.  Cardiovascular:     Rate and Rhythm: Normal rate and regular rhythm.     Pulses: Normal pulses.     Heart sounds: Normal heart sounds. No murmur heard.    No friction rub. No gallop.  Pulmonary:     Effort: Pulmonary effort is normal. No respiratory distress.     Breath sounds: Normal breath sounds.  No stridor. No wheezing, rhonchi or rales.  Chest:     Chest wall: No tenderness.  Musculoskeletal:        General: No swelling, tenderness, deformity or signs of injury.     Right lower leg: No edema.     Left lower leg: No edema.  Skin:    Capillary Refill: Capillary refill takes less than 2 seconds.  Neurological:     General: No focal deficit present.     Mental Status: He is alert and oriented to person, place, and time.     Motor: No weakness.     Coordination: Coordination normal.     Gait: Gait normal.     Deep Tendon Reflexes: Reflexes normal.  Psychiatric:        Mood and Affect: Mood normal.        Behavior: Behavior normal.        Thought Content: Thought content normal.        Judgment: Judgment normal.     Assessment & Plan:   Problem List Items Addressed This Visit       Cardiovascular and Mediastinum   High blood pressure - Primary    BP: (!) 150/86  BP Readings from Last 3 Encounters:  04/27/22 (!) 150/86  04/18/22 (!) 177/123  03/31/22 (!) 145/90  Currently on amlodipine 5 mg daily, started taking the medication about a week ago. Patient encouraged to continue to take amlodipine 5 mg daily, if his blood pressure is greater than 140/90 at next visit we will start patient on ACE/ARB for kidney protection.  DASH diet advised patient encouraged to engage in regular moderate exercise at least 150 minutes weekly. Lab Results  Component Value Date   NA 137 03/18/2022   K 3.8 03/18/2022   CO2 23 03/18/2022   GLUCOSE 90 03/18/2022   BUN 11 03/18/2022   CREATININE 1.37 (H) 03/18/2022   CALCIUM 9.6 03/18/2022   GFRNONAA 60 (L) 03/18/2022       Relevant Medications   amLODipine (NORVASC) 5 MG tablet     Genitourinary   CKD (chronic kidney disease) stage 2, GFR 60-89 ml/min    Lab Results  Component Value Date   NA 137 03/18/2022   K 3.8 03/18/2022   CO2 23 03/18/2022   GLUCOSE 90 03/18/2022   BUN 11 03/18/2022   CREATININE 1.37 (H) 03/18/2022    CALCIUM 9.6 03/18/2022   GFRNONAA 60 (L) 03/18/2022  will start patient on ACE/ARB at next visit if BP remains greater than 140/90 Avoid NSAIDs and other nephrotoxic agents  Need to get BP under control discussed.         Other   Cigarette smoker    Smokes about 2 cigarettes /day  Asked about quitting: confirms that he/she currently smokes cigarettes Advise to quit smoking: Educated about QUITTING to reduce the risk of cancer, cardio and cerebrovascular disease. Assess willingness: Unwilling to quit at this time, but is working on cutting back. Assist with counseling and pharmacotherapy: Counseled for 5 minutes. Arrange for follow up: follow up in 1 months and continue to offer help.      Health care maintenance    Lab Results  Component Value Date   HGBA1C 5.1 04/27/2022   HGBA1C 5.1 04/27/2022   HGBA1C 5.1 (A) 04/27/2022   HGBA1C 5.1 04/27/2022  Nagative for for T2DM Urine test was negative for protein, glucose Patient encouraged to consider getting flu vaccine, shingles vaccine, Tdap vaccines.  He refused all vaccines today. Cologuard ordered to screen to screen for colon cancer       Relevant Orders   POCT glycosylated hemoglobin (Hb A1C) (Completed)   POCT URINALYSIS DIP (CLINITEK) (Completed)   POCT glucose (manual entry) (Completed)   Overweight (BMI 25.0-29.9)    Wt Readings from Last 3 Encounters:  04/27/22 191 lb 3.2 oz (86.7 kg)  03/31/22 193 lb (87.5 kg)  03/18/22 185 lb (83.9 kg)  Patient counseled on low-carb diet, need to engage in regular moderate exercises at least  150 minutes weekly discussed.  Benefits of healthy weight dicussed       Other Visit Diagnoses     Screening for colon cancer       Relevant Orders   Cologuard   Screening due       Relevant Orders   TSH   VITAMIN D 25 Hydroxy (Vit-D Deficiency, Fractures)   Lipid Panel   Hepatitis C antibody       Outpatient Encounter Medications as of 04/27/2022  Medication Sig    [DISCONTINUED] amLODipine (NORVASC) 5 MG tablet Take 1 tablet (5 mg total) by mouth daily.   amLODipine (NORVASC) 5 MG tablet Take 1 tablet (5 mg total) by mouth daily.   No facility-administered encounter medications on file as of 04/27/2022.    Follow-up: Return in about 4 weeks (around 05/25/2022) for CPE.   Donell Beers, FNP

## 2022-04-27 NOTE — Assessment & Plan Note (Signed)
Smokes about 2 cigarettes /day  Asked about quitting: confirms that he/she currently smokes cigarettes Advise to quit smoking: Educated about QUITTING to reduce the risk of cancer, cardio and cerebrovascular disease. Assess willingness: Unwilling to quit at this time, but is working on cutting back. Assist with counseling and pharmacotherapy: Counseled for 5 minutes. Arrange for follow up: follow up in 1 months and continue to offer help.

## 2022-04-27 NOTE — Assessment & Plan Note (Addendum)
Lab Results  Component Value Date   HGBA1C 5.1 04/27/2022   HGBA1C 5.1 04/27/2022   HGBA1C 5.1 (A) 04/27/2022   HGBA1C 5.1 04/27/2022  Nagative for for T2DM Urine test was negative for protein, glucose Patient encouraged to consider getting flu vaccine, shingles vaccine, Tdap vaccines.  He refused all vaccines today. Cologuard ordered to screen to screen for colon cancer

## 2022-04-27 NOTE — Assessment & Plan Note (Signed)
Lab Results  Component Value Date   NA 137 03/18/2022   K 3.8 03/18/2022   CO2 23 03/18/2022   GLUCOSE 90 03/18/2022   BUN 11 03/18/2022   CREATININE 1.37 (H) 03/18/2022   CALCIUM 9.6 03/18/2022   GFRNONAA 60 (L) 03/18/2022  will start patient on ACE/ARB at next visit if BP remains greater than 140/90 Avoid NSAIDs and other nephrotoxic agents Need to get BP under control discussed.

## 2022-04-27 NOTE — Assessment & Plan Note (Signed)
BP: (!) 150/86  BP Readings from Last 3 Encounters:  04/27/22 (!) 150/86  04/18/22 (!) 177/123  03/31/22 (!) 145/90   Currently on amlodipine 5 mg daily, started taking the medication about a week ago. Patient encouraged to continue to take amlodipine 5 mg daily, if his blood pressure is greater than 140/90 at next visit we will start patient on ACE/ARB for kidney protection.  DASH diet advised patient encouraged to engage in regular moderate exercise at least 150 minutes weekly. Lab Results  Component Value Date   NA 137 03/18/2022   K 3.8 03/18/2022   CO2 23 03/18/2022   GLUCOSE 90 03/18/2022   BUN 11 03/18/2022   CREATININE 1.37 (H) 03/18/2022   CALCIUM 9.6 03/18/2022   GFRNONAA 60 (L) 03/18/2022

## 2022-05-06 ENCOUNTER — Other Ambulatory Visit: Payer: Self-pay

## 2022-05-27 ENCOUNTER — Ambulatory Visit: Payer: Self-pay | Admitting: Nurse Practitioner

## 2022-06-13 ENCOUNTER — Ambulatory Visit: Payer: Self-pay | Admitting: Nurse Practitioner

## 2022-08-15 ENCOUNTER — Other Ambulatory Visit: Payer: Self-pay | Admitting: Nurse Practitioner

## 2022-08-15 DIAGNOSIS — I10 Essential (primary) hypertension: Secondary | ICD-10-CM

## 2022-12-13 ENCOUNTER — Other Ambulatory Visit: Payer: Self-pay

## 2022-12-13 ENCOUNTER — Other Ambulatory Visit: Payer: Self-pay | Admitting: Nurse Practitioner

## 2022-12-13 DIAGNOSIS — I10 Essential (primary) hypertension: Secondary | ICD-10-CM

## 2022-12-13 MED ORDER — AMLODIPINE BESYLATE 5 MG PO TABS: 5.0000 mg | ORAL_TABLET | Freq: Every day | ORAL | 0 refills | Status: DC

## 2022-12-14 ENCOUNTER — Encounter: Payer: Self-pay | Admitting: Nurse Practitioner

## 2022-12-14 ENCOUNTER — Ambulatory Visit (INDEPENDENT_AMBULATORY_CARE_PROVIDER_SITE_OTHER): Payer: 59 | Admitting: Nurse Practitioner

## 2022-12-14 VITALS — BP 155/92 | HR 64 | Temp 98.2°F | Ht 69.0 in | Wt 190.0 lb

## 2022-12-14 DIAGNOSIS — N182 Chronic kidney disease, stage 2 (mild): Secondary | ICD-10-CM | POA: Diagnosis not present

## 2022-12-14 DIAGNOSIS — I10 Essential (primary) hypertension: Secondary | ICD-10-CM | POA: Diagnosis not present

## 2022-12-14 DIAGNOSIS — F17210 Nicotine dependence, cigarettes, uncomplicated: Secondary | ICD-10-CM

## 2022-12-14 MED ORDER — VALSARTAN 80 MG PO TABS: 80.0000 mg | ORAL_TABLET | Freq: Every day | ORAL | 3 refills | Status: DC

## 2022-12-14 NOTE — Assessment & Plan Note (Addendum)
Smokes about 4-5 cigarettes /day  Asked about quitting: confirms that he/she currently smokes cigarettes Advise to quit smoking: Educated about QUITTING to reduce the risk of cancer, cardio and cerebrovascular disease. Assess willingness: Unwilling to quit at this time, but is working on cutting back. Assist with counseling and pharmacotherapy: Counseled for 5 minutes and  Arrange for follow up: follow up in 3 months and continue to offer help.

## 2022-12-14 NOTE — Assessment & Plan Note (Signed)
Lab Results  Component Value Date   NA 137 03/18/2022   K 3.8 03/18/2022   CO2 23 03/18/2022   BUN 11 03/18/2022   CREATININE 1.37 (H) 03/18/2022   CALCIUM 9.6 03/18/2022   GLUCOSE 90 03/18/2022  Avoid NSAIDs and other nephrotoxic agents Statin valsartan 80 mg daily Recommend 64 ounces of water daily to maintain hydration

## 2022-12-14 NOTE — Assessment & Plan Note (Signed)
BP Readings from Last 3 Encounters:  12/14/22 (!) 155/92  04/27/22 (!) 150/86  04/18/22 (!) 177/123   HTN uncontrolled not taking any medication Will start valsartan 80 mg daily this will assist with kidney protection Discussed DASH diet and dietary sodium restrictions Continue to increase dietary efforts and exercise.  Smoking cessation encouraged Monitor blood pressure at home keep a log and bring to next visit in 2 weeks CMP today Will do a BMP in 2 weeks

## 2022-12-14 NOTE — Patient Instructions (Signed)
1. Primary hypertension  - valsartan (DIOVAN) 80 MG tablet; Take 1 tablet (80 mg total) by mouth daily.  Dispense: 90 tablet; Refill: 3 - CMP14+EGFR   Around 3 times per week, check your blood pressure 2 times per day. once in the morning and once in the evening. The readings should be at least one minute apart. Write down these values and bring them to your next nurse visit/appointment.  When you check your BP, make sure you have been doing something calm/relaxing 5 minutes prior to checking. Both feet should be flat on the floor and you should be sitting. Use your left arm and make sure it is in a relaxed position (on a table), and that the cuff is at the approximate level/height of your heart.   It is important that you exercise regularly at least 30 minutes 5 times a week as tolerated  Think about what you will eat, plan ahead. Choose " clean, green, fresh or frozen" over canned, processed or packaged foods which are more sugary, salty and fatty. 70 to 75% of food eaten should be vegetables and fruit. Three meals at set times with snacks allowed between meals, but they must be fruit or vegetables. Aim to eat over a 12 hour period , example 7 am to 7 pm, and STOP after  your last meal of the day. Drink water,generally about 64 ounces per day, no other drink is as healthy. Fruit juice is best enjoyed in a healthy way, by EATING the fruit.  Thanks for choosing [facility] we consider it a privelige to serve you.

## 2022-12-14 NOTE — Progress Notes (Signed)
Established Patient Office Visit  Subjective:  Patient ID: Timothy Harrington, male    DOB: 28-Jan-1966  Age: 57 y.o. MRN: [id number]  CC:  Chief Complaint  Patient presents with   Hypertension    HPI Timothy Harrington is a 57 y.o. male with past medical history of high blood pressure, CKD stage II, cigarettes smoker who presents for follow-up for his chronic medical conditions.  Hypertension.  He has amlodipine 5 mg daily ordered but he has not been taking the medication for some months, stated that he ran out of the medication and did not get a refill.  He is planning to do better.  he denies edema, chest pain, HA  Current smoker smokes 4 to 5 cigarettes daily smoking cessation encouraged.  He has received the Cologuard testing came but has not returned it he was encouraged to complete the test as planned.  Need for shingles vaccine, Tdap vaccine discussed with the patient, stated that he would not get the vaccine.  He was encouraged to consider getting the vaccines   Past Medical History:  Diagnosis Date   Hypertension    Kidney stones     Past Surgical History:  Procedure Laterality Date   KIDNEY STONE SURGERY      Family History  Problem Relation Age of Onset   Stomach cancer Mother    Diabetes Mother    Kidney disease Father    Heart disease Sister    Cancer Neg Hx    Hypertension Neg Hx     Social History   Socioeconomic History   Marital status: Legally Separated    Spouse name: Not on file   Number of children: 3   Years of education: Not on file   Highest education level: Not on file  Occupational History   Not on file  Tobacco Use   Smoking status: Every Day    Packs/day: .1    Types: Cigarettes   Smokeless tobacco: Never   Tobacco comments:    2 cigs per day  Substance and Sexual Activity   Alcohol use: Yes    Comment: 1-2 drinks per day   Drug use: Yes    Frequency: 3.0 times per week    Comment: Marijuana hx   Sexual activity: Yes    Birth  control/protection: None  Other Topics Concern   Not on file  Social History Narrative   Right handed. Lives wit his daughter.    Social Determinants of Health   Financial Resource Strain: Not on file  Food Insecurity: Not on file  Transportation Needs: Not on file  Physical Activity: Not on file  Stress: Not on file  Social Connections: Not on file  Intimate Partner Violence: Not on file    Outpatient Medications Prior to Visit  Medication Sig Dispense Refill   amLODipine (NORVASC) 5 MG tablet TAKE 1 TABLET (5 MG TOTAL) BY MOUTH DAILY. (Patient not taking: Reported on 12/14/2022) 90 tablet 0   No facility-administered medications prior to visit.    No Known Allergies  ROS Review of Systems  Constitutional:  Negative for activity change, appetite change, chills, diaphoresis, fatigue, fever and unexpected weight change.  HENT:  Negative for congestion, dental problem, drooling and ear discharge.   Eyes:  Negative for pain, discharge, redness and itching.  Respiratory:  Negative for apnea, cough, choking, chest tightness, shortness of breath and wheezing.   Cardiovascular: Negative.  Negative for chest pain, palpitations and leg swelling.  Gastrointestinal:  Negative  for abdominal distention, abdominal pain, anal bleeding, blood in stool, constipation, diarrhea and vomiting.  Endocrine: Negative for polydipsia, polyphagia and polyuria.  Genitourinary:  Negative for difficulty urinating, flank pain, frequency and genital sores.  Musculoskeletal: Negative.  Negative for arthralgias, back pain, gait problem and joint swelling.  Skin:  Negative for color change, pallor and rash.  Neurological:  Negative for dizziness, facial asymmetry, light-headedness, numbness and headaches.  Psychiatric/Behavioral:  Negative for agitation, behavioral problems, confusion, hallucinations, self-injury, sleep disturbance and suicidal ideas.       Objective:    Physical Exam Vitals and nursing  note reviewed.  Constitutional:      General: He is not in acute distress.    Appearance: Normal appearance. He is obese. He is not ill-appearing, toxic-appearing or diaphoretic.  HENT:     Mouth/Throat:     Mouth: Mucous membranes are moist.     Pharynx: Oropharynx is clear. No oropharyngeal exudate or posterior oropharyngeal erythema.  Eyes:     General: No scleral icterus.       Right eye: No discharge.        Left eye: No discharge.     Extraocular Movements: Extraocular movements intact.     Conjunctiva/sclera: Conjunctivae normal.  Cardiovascular:     Rate and Rhythm: Normal rate and regular rhythm.     Pulses: Normal pulses.     Heart sounds: Normal heart sounds. No murmur heard.    No friction rub. No gallop.  Pulmonary:     Effort: Pulmonary effort is normal. No respiratory distress.     Breath sounds: Normal breath sounds. No stridor. No wheezing, rhonchi or rales.  Chest:     Chest wall: No tenderness.  Abdominal:     General: There is no distension.     Palpations: Abdomen is soft.     Tenderness: There is no abdominal tenderness. There is no right CVA tenderness, left CVA tenderness or guarding.  Musculoskeletal:        General: No swelling, tenderness, deformity or signs of injury.     Right lower leg: No edema.     Left lower leg: No edema.  Skin:    General: Skin is warm and dry.     Capillary Refill: Capillary refill takes less than 2 seconds.     Coloration: Skin is not jaundiced or pale.     Findings: No bruising, erythema or lesion.  Neurological:     Mental Status: He is alert and oriented to person, place, and time.     Motor: No weakness.     Coordination: Coordination normal.     Gait: Gait normal.  Psychiatric:        Mood and Affect: Mood normal.        Behavior: Behavior normal.        Thought Content: Thought content normal.        Judgment: Judgment normal.     BP (!) 155/92   Pulse 64   Temp 98.2 F (36.8 C)   Ht 5\' 9"  (1.753 m)    Wt 190 lb (86.2 kg)   SpO2 100%   BMI 28.06 kg/m  Wt Readings from Last 3 Encounters:  12/14/22 190 lb (86.2 kg)  04/27/22 191 lb 3.2 oz (86.7 kg)  03/31/22 193 lb (87.5 kg)    No results found for: "TSH" Lab Results  Component Value Date   WBC 7.5 03/18/2022   HGB 15.5 03/18/2022   HCT 43.0 03/18/2022  MCV 80.7 03/18/2022   PLT 214 03/18/2022   Lab Results  Component Value Date   NA 137 03/18/2022   K 3.8 03/18/2022   CO2 23 03/18/2022   GLUCOSE 90 03/18/2022   BUN 11 03/18/2022   CREATININE 1.37 (H) 03/18/2022   BILITOT 1.1 04/21/2016   ALKPHOS 85 04/21/2016   AST 25 04/21/2016   ALT 33 04/21/2016   PROT 7.5 04/21/2016   ALBUMIN 3.9 04/21/2016   CALCIUM 9.6 03/18/2022   ANIONGAP 11 03/18/2022   No results found for: "CHOL" No results found for: "HDL" No results found for: "LDLCALC" No results found for: "TRIG" No results found for: "CHOLHDL" Lab Results  Component Value Date   HGBA1C 5.1 04/27/2022   HGBA1C 5.1 04/27/2022   HGBA1C 5.1 (A) 04/27/2022   HGBA1C 5.1 04/27/2022      Assessment & Plan:   Problem List Items Addressed This Visit       Cardiovascular and Mediastinum   High blood pressure - Primary    BP Readings from Last 3 Encounters:  12/14/22 (!) 155/92  04/27/22 (!) 150/86  04/18/22 (!) 177/123   HTN uncontrolled not taking any medication Will start valsartan 80 mg daily this will assist with kidney protection Discussed DASH diet and dietary sodium restrictions Continue to increase dietary efforts and exercise.  Smoking cessation encouraged Monitor blood pressure at home keep a log and bring to next visit in 2 weeks CMP today Will do a BMP in 2 weeks      Relevant Medications   valsartan (DIOVAN) 80 MG tablet   Other Relevant Orders   CMP14+EGFR     Genitourinary   CKD (chronic kidney disease) stage 2, GFR 60-89 ml/min    Lab Results  Component Value Date   NA 137 03/18/2022   K 3.8 03/18/2022   CO2 23 03/18/2022   BUN  11 03/18/2022   CREATININE 1.37 (H) 03/18/2022   CALCIUM 9.6 03/18/2022   GLUCOSE 90 03/18/2022  Avoid NSAIDs and other nephrotoxic agents Statin valsartan 80 mg daily Recommend 64 ounces of water daily to maintain hydration        Other   Cigarette smoker    Smokes about 4-5 cigarettes /day  Asked about quitting: confirms that he/she currently smokes cigarettes Advise to quit smoking: Educated about QUITTING to reduce the risk of cancer, cardio and cerebrovascular disease. Assess willingness: Unwilling to quit at this time, but is working on cutting back. Assist with counseling and pharmacotherapy: Counseled for 5 minutes and  Arrange for follow up: follow up in 3 months and continue to offer help.        Meds ordered this encounter  Medications   valsartan (DIOVAN) 80 MG tablet    Sig: Take 1 tablet (80 mg total) by mouth daily.    Dispense:  90 tablet    Refill:  3    Follow-up: Return in about 2 weeks (around 12/28/2022) for CPE.    Donell Beers, FNP

## 2022-12-15 LAB — CMP14+EGFR
ALT: 22 IU/L (ref 0–44)
AST: 20 IU/L (ref 0–40)
Albumin/Globulin Ratio: 1.3 (ref 1.2–2.2)
Albumin: 4.4 g/dL (ref 3.8–4.9)
Alkaline Phosphatase: 73 IU/L (ref 44–121)
BUN/Creatinine Ratio: 8 — ABNORMAL LOW (ref 9–20)
BUN: 11 mg/dL (ref 6–24)
Bilirubin Total: 0.6 mg/dL (ref 0.0–1.2)
CO2: 23 mmol/L (ref 20–29)
Calcium: 9.5 mg/dL (ref 8.7–10.2)
Chloride: 102 mmol/L (ref 96–106)
Creatinine, Ser: 1.46 mg/dL — ABNORMAL HIGH (ref 0.76–1.27)
Globulin, Total: 3.4 g/dL (ref 1.5–4.5)
Glucose: 100 mg/dL — ABNORMAL HIGH (ref 70–99)
Potassium: 4.6 mmol/L (ref 3.5–5.2)
Sodium: 141 mmol/L (ref 134–144)
Total Protein: 7.8 g/dL (ref 6.0–8.5)
eGFR: 55 mL/min/{1.73_m2} — ABNORMAL LOW (ref >59)

## 2022-12-27 ENCOUNTER — Telehealth: Payer: Self-pay

## 2022-12-27 NOTE — Telephone Encounter (Signed)
Patient attempted to be outreached by Christella Noa, PharmD Candidate on 12/27/22 to discuss hypertension. Left voicemail for patient to return our call at their convenience at [phone].  Christella Noa, Student-PharmD

## 2022-12-28 ENCOUNTER — Ambulatory Visit (INDEPENDENT_AMBULATORY_CARE_PROVIDER_SITE_OTHER): Payer: 59 | Admitting: Nurse Practitioner

## 2022-12-28 VITALS — BP 142/90 | HR 65 | Temp 97.5°F | Ht 66.0 in | Wt 186.4 lb

## 2022-12-28 DIAGNOSIS — K219 Gastro-esophageal reflux disease without esophagitis: Secondary | ICD-10-CM

## 2022-12-28 DIAGNOSIS — Z13228 Encounter for screening for other metabolic disorders: Secondary | ICD-10-CM

## 2022-12-28 DIAGNOSIS — F17210 Nicotine dependence, cigarettes, uncomplicated: Secondary | ICD-10-CM | POA: Diagnosis not present

## 2022-12-28 DIAGNOSIS — I10 Essential (primary) hypertension: Secondary | ICD-10-CM | POA: Diagnosis not present

## 2022-12-28 DIAGNOSIS — N529 Male erectile dysfunction, unspecified: Secondary | ICD-10-CM | POA: Diagnosis not present

## 2022-12-28 DIAGNOSIS — Z1329 Encounter for screening for other suspected endocrine disorder: Secondary | ICD-10-CM | POA: Diagnosis not present

## 2022-12-28 DIAGNOSIS — Z0000 Encounter for general adult medical examination without abnormal findings: Secondary | ICD-10-CM | POA: Insufficient documentation

## 2022-12-28 DIAGNOSIS — F1290 Cannabis use, unspecified, uncomplicated: Secondary | ICD-10-CM

## 2022-12-28 DIAGNOSIS — Z1321 Encounter for screening for nutritional disorder: Secondary | ICD-10-CM

## 2022-12-28 DIAGNOSIS — Z789 Other specified health status: Secondary | ICD-10-CM

## 2022-12-28 DIAGNOSIS — Z130 Encounter for screening for diseases of the blood and blood-forming organs and certain disorders involving the immune mechanism: Secondary | ICD-10-CM

## 2022-12-28 DIAGNOSIS — Z125 Encounter for screening for malignant neoplasm of prostate: Secondary | ICD-10-CM

## 2022-12-28 MED ORDER — VALSARTAN 160 MG PO TABS: 160.0000 mg | ORAL_TABLET | Freq: Every day | ORAL | 3 refills | Status: DC

## 2022-12-28 MED ORDER — SILDENAFIL CITRATE 50 MG PO TABS: 50.0000 mg | ORAL_TABLET | Freq: Every day | ORAL | 2 refills | Status: DC | PRN

## 2022-12-28 NOTE — Patient Instructions (Addendum)
Viagra  50 mg once daily as needed 1 hour before sexual activity; may be taken up to 4 hours before sexual activity. Reduce to 25 mg once daily if side effects occur  1. Screening for endocrine, nutritional, metabolic and immunity disorder  - CMP14+EGFR - Hepatitis C antibody - TSH - Lipid panel - CBC - PSA  2. Annual physical exam   3. Primary hypertension  - valsartan (DIOVAN) 160 MG tablet; Take 1 tablet (160 mg total) by mouth daily.  Dispense: 90 tablet; Refill: 3  Blood pressure goal is less than 140/90  4. Erectile dysfunction, unspecified erectile dysfunction type  - sildenafil (VIAGRA) 50 MG tablet; Take 1 tablet (50 mg total) by mouth daily as needed for erectile dysfunction.  Dispense: 10 tablet; Refill: 2   It is important that you exercise regularly at least 30 minutes 5 times a week as tolerated  Think about what you will eat, plan ahead. Choose " clean, green, fresh or frozen" over canned, processed or packaged foods which are more sugary, salty and fatty. 70 to 75% of food eaten should be vegetables and fruit. Three meals at set times with snacks allowed between meals, but they must be fruit or vegetables. Aim to eat over a 12 hour period , example 7 am to 7 pm, and STOP after  your last meal of the day. Drink water,generally about 64 ounces per day, no other drink is as healthy. Fruit juice is best enjoyed in a healthy way, by EATING the fruit.  Thanks for choosing [facility] we consider it a privelige to serve you.

## 2022-12-28 NOTE — Progress Notes (Signed)
Complete physical exam  Patient: Timothy Harrington   DOB: 07-May-1966   57 y.o. Male  MRN: [id number]  Subjective:    Chief Complaint  Patient presents with   Annual Exam    Timothy Harrington is a 57 y.o. male who presents today for a complete physical exam. He reports consuming a general diet. Walks some but can do better The patient does not participate in regular exercise at present. He generally feels well. He reports sleeping well.  Patient denies any adverse reactions to current medications Encouraged to get shingles vaccine to the vaccine at the pharmacy, need to get colon cancer done discussed.   Most recent fall risk assessment:    12/14/2022   10:46 AM  Fall Risk   Falls in the past year? 0  Number falls in past yr: 0  Injury with Fall? 0  Risk for fall due to : No Fall Risks  Follow up Falls evaluation completed     Most recent depression screenings:    12/28/2022    1:19 PM 12/14/2022   10:47 AM  PHQ 2/9 Scores  PHQ - 2 Score 0 0        Patient Care Team: Donell Beers, FNP as PCP - General (Nurse Practitioner)   Outpatient Medications Prior to Visit  Medication Sig   [DISCONTINUED] valsartan (DIOVAN) 80 MG tablet Take 1 tablet (80 mg total) by mouth daily.   No facility-administered medications prior to visit.    Review of Systems  Constitutional:  Negative for activity change, appetite change, chills, diaphoresis, fatigue, fever and unexpected weight change.  HENT:  Negative for congestion, dental problem, drooling and ear discharge.   Eyes:  Negative for pain, discharge, redness and itching.  Respiratory:  Negative for apnea, cough, choking, chest tightness, shortness of breath and wheezing.   Cardiovascular: Negative.  Negative for chest pain, palpitations and leg swelling.  Gastrointestinal:  Negative for abdominal distention, abdominal pain, anal bleeding, blood in stool, constipation, diarrhea and vomiting.       Acid reflux  Endocrine:  Negative for polydipsia, polyphagia and polyuria.  Genitourinary:  Negative for difficulty urinating, flank pain, frequency and genital sores.  Musculoskeletal: Negative.  Negative for arthralgias, back pain, gait problem and joint swelling.  Skin:  Negative for color change, pallor and rash.  Neurological:  Negative for facial asymmetry, light-headedness, numbness and headaches.  Psychiatric/Behavioral:  Negative for agitation, behavioral problems, confusion, hallucinations, self-injury, sleep disturbance and suicidal ideas.        Objective:     BP (!) 142/90   Pulse 65   Temp (!) 97.5 F (36.4 C)   Ht 5\' 6"  (1.676 m)   Wt 186 lb 6.4 oz (84.6 kg)   SpO2 97%   BMI 30.09 kg/m    Physical Exam Vitals and nursing note reviewed. Exam conducted with a chaperone present.  Constitutional:      General: He is not in acute distress.    Appearance: Normal appearance. He is obese. He is not ill-appearing, toxic-appearing or diaphoretic.  HENT:     Right Ear: Tympanic membrane, ear canal and external ear normal. There is no impacted cerumen.     Left Ear: Tympanic membrane, ear canal and external ear normal. There is no impacted cerumen.     Nose: Nose normal. No congestion or rhinorrhea.     Mouth/Throat:     Mouth: Mucous membranes are moist.     Pharynx: Oropharynx is clear. No oropharyngeal  exudate or posterior oropharyngeal erythema.  Eyes:     General: No scleral icterus.       Right eye: No discharge.        Left eye: No discharge.     Extraocular Movements: Extraocular movements intact.     Conjunctiva/sclera: Conjunctivae normal.  Neck:     Vascular: No carotid bruit.  Cardiovascular:     Rate and Rhythm: Normal rate and regular rhythm.     Pulses: Normal pulses.     Heart sounds: Normal heart sounds. No murmur heard.    No friction rub. No gallop.  Pulmonary:     Effort: Pulmonary effort is normal. No respiratory distress.     Breath sounds: Normal breath sounds. No  stridor. No wheezing, rhonchi or rales.  Chest:     Chest wall: No tenderness.  Abdominal:     General: Bowel sounds are normal. There is no distension.     Palpations: Abdomen is soft. There is no mass.     Tenderness: There is no abdominal tenderness. There is no right CVA tenderness, left CVA tenderness, guarding or rebound.     Hernia: No hernia is present.  Musculoskeletal:        General: No swelling, tenderness, deformity or signs of injury.     Cervical back: Normal range of motion and neck supple. No rigidity or tenderness.     Right lower leg: No edema.     Left lower leg: No edema.     Comments: Mild epigastric abdominal tenderness  Lymphadenopathy:     Cervical: No cervical adenopathy.  Skin:    General: Skin is warm and dry.     Capillary Refill: Capillary refill takes less than 2 seconds.     Coloration: Skin is not jaundiced or pale.     Findings: No bruising, erythema, lesion or rash.  Neurological:     Mental Status: He is alert and oriented to person, place, and time.     Cranial Nerves: No cranial nerve deficit.     Sensory: No sensory deficit.     Motor: No weakness.     Coordination: Coordination normal.     Gait: Gait normal.     Deep Tendon Reflexes: Reflexes normal.  Psychiatric:        Mood and Affect: Mood normal.        Behavior: Behavior normal.        Thought Content: Thought content normal.        Judgment: Judgment normal.     No results found for any visits on 12/28/22.     Assessment & Plan:    Routine Health Maintenance and Physical Exam   There is no immunization history on file for this patient.  Health Maintenance  Topic Date Due   COVID-19 Vaccine (1) Never done   Hepatitis C Screening  Never done   DTaP/Tdap/Td (1 - Tdap) Never done   Colonoscopy  Never done   Zoster Vaccines- Shingrix (1 of 2) Never done   INFLUENZA VACCINE  02/23/2023   HIV Screening  Completed   HPV VACCINES  Aged Out    Discussed health benefits of  physical activity, and encouraged him to engage in regular exercise appropriate for his age and condition.  Problem List Items Addressed This Visit       Cardiovascular and Mediastinum   High blood pressure    BP Readings from Last 3 Encounters:  12/28/22 (!) 142/90  12/14/22 (!) 155/92  04/27/22 (!) 150/86  Blood pressure reading much improved  on valsartan 80 mg daily but not at goal. Patient reports that he has been taking the medication on most days Starts valsartan 160 mg daily blood pressure goal is less than 140/90 Was encouraged to monitor blood pressure at home, currently not checking his blood pressure Follow-up in the office with a nurse in 2 weeks for blood pressure check CMP today Smoking cessation encouraged DASH diet advised engage in regular moderate exercise at least 150 minutes wwekly      Relevant Medications   valsartan (DIOVAN) 160 MG tablet   sildenafil (VIAGRA) 50 MG tablet   Other Relevant Orders   Recheck vitals     Digestive   Gastroesophageal reflux disease    Takes tums as needed Avoid fatty fried foods spicy food alcohol, caffeinated drinks        Other   Cigarette smoker    Smokes about 4-5 cigarettes /day  Asked about quitting: confirms that he/she currently smokes cigarettes Advise to quit smoking: Educated about QUITTING to reduce the risk of cancer, cardio and cerebrovascular disease. Assess willingness: Unwilling to quit at this time, but is working on cutting back. Assist with counseling and pharmacotherapy: Counseled for 5 minutes and literature provided. Arrange for follow up: follow up in 2 months and continue to offer help.       Screening for endocrine, nutritional, metabolic and immunity disorder   Relevant Orders   CMP14+EGFR   Hepatitis C antibody   TSH   Lipid panel   CBC   PSA   Marijuana smoker    Need to avoid smoking marijuana discussed      Alcohol use    Need to drink responsibly dicussed.       Annual  physical exam - Primary    Annual exam as documented.  Counseling done include healthy lifestyle involving committing to 150 minutes of exercise per week, heart healthy diet, and attaining healthy weight. The importance of adequate sleep also discussed.  Regular use of seat belt and home safety were also discussed . Changes in health habits are decided on by patient with goals and time frames set for achieving them. Immunization and cancer screening  needs are specifically addressed at this visit.    Patient encouraged to get Tdap vaccine and shingles vaccine at the pharmacy Encouraged to get his Cologuard test done Fasting labs today      Erectile dysfunction    Patient complains of erectile dysfunction for about 2 years  He has not tried any medication in the past Start Viagra 50 mg daily as needed Side effect of yogurt discussed Checking PSA, testosterone levels       Relevant Medications   sildenafil (VIAGRA) 50 MG tablet   Other Relevant Orders   Testosterone   Return in about 2 months (around 02/27/2023) for HTN.     Donell Beers, FNP

## 2022-12-28 NOTE — Assessment & Plan Note (Signed)
BP Readings from Last 3 Encounters:  12/28/22 (!) 142/90  12/14/22 (!) 155/92  04/27/22 (!) 150/86  Blood pressure reading much improved  on valsartan 80 mg daily but not at goal. Patient reports that he has been taking the medication on most days Starts valsartan 160 mg daily blood pressure goal is less than 140/90 Was encouraged to monitor blood pressure at home, currently not checking his blood pressure Follow-up in the office with a nurse in 2 weeks for blood pressure check CMP today Smoking cessation encouraged DASH diet advised engage in regular moderate exercise at least 150 minutes wwekly

## 2022-12-28 NOTE — Assessment & Plan Note (Signed)
Patient complains of erectile dysfunction for about 2 years  He has not tried any medication in the past Start Viagra 50 mg daily as needed Side effect of yogurt discussed Checking PSA, testosterone levels

## 2022-12-28 NOTE — Assessment & Plan Note (Signed)
Need to avoid smoking marijuana discussed 

## 2022-12-28 NOTE — Assessment & Plan Note (Signed)
Annual exam as documented.  Counseling done include healthy lifestyle involving committing to 150 minutes of exercise per week, heart healthy diet, and attaining healthy weight. The importance of adequate sleep also discussed.  Regular use of seat belt and home safety were also discussed . Changes in health habits are decided on by patient with goals and time frames set for achieving them. Immunization and cancer screening  needs are specifically addressed at this visit.    Patient encouraged to get Tdap vaccine and shingles vaccine at the pharmacy Encouraged to get his Cologuard test done Fasting labs today

## 2022-12-28 NOTE — Assessment & Plan Note (Signed)
Need to drink responsibly dicussed.

## 2022-12-28 NOTE — Assessment & Plan Note (Signed)
Smokes about 4-5 cigarettes /day  Asked about quitting: confirms that he/she currently smokes cigarettes Advise to quit smoking: Educated about QUITTING to reduce the risk of cancer, cardio and cerebrovascular disease. Assess willingness: Unwilling to quit at this time, but is working on cutting back. Assist with counseling and pharmacotherapy: Counseled for 5 minutes and literature provided. Arrange for follow up: follow up in 2 months and continue to offer help.

## 2022-12-28 NOTE — Assessment & Plan Note (Signed)
Takes tums as needed Avoid fatty fried foods spicy food alcohol, caffeinated drinks

## 2022-12-29 LAB — CMP14+EGFR
ALT: 20 IU/L (ref 0–44)
AST: 22 IU/L (ref 0–40)
Albumin/Globulin Ratio: 1.3 (ref 1.2–2.2)
Albumin: 4.5 g/dL (ref 3.8–4.9)
Alkaline Phosphatase: 72 IU/L (ref 44–121)
BUN/Creatinine Ratio: 8 — ABNORMAL LOW (ref 9–20)
BUN: 12 mg/dL (ref 6–24)
Bilirubin Total: 1.4 mg/dL — ABNORMAL HIGH (ref 0.0–1.2)
CO2: 24 mmol/L (ref 20–29)
Calcium: 9.4 mg/dL (ref 8.7–10.2)
Chloride: 102 mmol/L (ref 96–106)
Creatinine, Ser: 1.51 mg/dL — ABNORMAL HIGH (ref 0.76–1.27)
Globulin, Total: 3.4 g/dL (ref 1.5–4.5)
Glucose: 105 mg/dL — ABNORMAL HIGH (ref 70–99)
Potassium: 4.2 mmol/L (ref 3.5–5.2)
Sodium: 139 mmol/L (ref 134–144)
Total Protein: 7.9 g/dL (ref 6.0–8.5)
eGFR: 53 mL/min/{1.73_m2} — ABNORMAL LOW (ref >59)

## 2022-12-29 LAB — CBC
Hematocrit: 45.6 % (ref 37.5–51.0)
Hemoglobin: 15.4 g/dL (ref 13.0–17.7)
MCH: 28.4 pg (ref 26.6–33.0)
MCHC: 33.8 g/dL (ref 31.5–35.7)
MCV: 84 fL (ref 79–97)
Platelets: 196 10*3/uL (ref 150–450)
RBC: 5.42 x10E6/uL (ref 4.14–5.80)
RDW: 13.9 % (ref 11.6–15.4)
WBC: 6.4 10*3/uL (ref 3.4–10.8)

## 2022-12-29 LAB — HEPATITIS C ANTIBODY: Hep C Virus Ab: NONREACTIVE

## 2022-12-29 LAB — LIPID PANEL
Chol/HDL Ratio: 4.8 ratio (ref 0.0–5.0)
Cholesterol, Total: 267 mg/dL — ABNORMAL HIGH (ref 100–199)
HDL: 56 mg/dL (ref >39)
LDL Chol Calc (NIH): 164 mg/dL — ABNORMAL HIGH (ref 0–99)
Triglycerides: 253 mg/dL — ABNORMAL HIGH (ref 0–149)
VLDL Cholesterol Cal: 47 mg/dL — ABNORMAL HIGH (ref 5–40)

## 2022-12-29 LAB — TSH: TSH: 1.55 u[IU]/mL (ref 0.450–4.500)

## 2022-12-29 LAB — PSA: Prostate Specific Ag, Serum: 1 ng/mL (ref 0.0–4.0)

## 2022-12-29 LAB — TESTOSTERONE: Testosterone: 267 ng/dL (ref 264–916)

## 2022-12-30 ENCOUNTER — Other Ambulatory Visit: Payer: Self-pay | Admitting: Nurse Practitioner

## 2022-12-30 DIAGNOSIS — E785 Hyperlipidemia, unspecified: Secondary | ICD-10-CM

## 2022-12-30 MED ORDER — ATORVASTATIN CALCIUM 10 MG PO TABS: 10.0000 mg | ORAL_TABLET | Freq: Every day | ORAL | 1 refills | Status: DC

## 2023-01-11 ENCOUNTER — Other Ambulatory Visit: Payer: Self-pay | Admitting: Nurse Practitioner

## 2023-01-11 ENCOUNTER — Ambulatory Visit (INDEPENDENT_AMBULATORY_CARE_PROVIDER_SITE_OTHER): Payer: 59

## 2023-01-11 VITALS — BP 131/77 | HR 73 | Temp 97.2°F | Wt 189.2 lb

## 2023-01-11 DIAGNOSIS — I10 Essential (primary) hypertension: Secondary | ICD-10-CM | POA: Diagnosis not present

## 2023-01-11 NOTE — Progress Notes (Signed)
   Blood Pressure Recheck Visit  Name: [name] MRN: [id number] Date of Birth: August 31, 1965  Timothy Harrington presents today for Blood Pressure recheck with clinical support staff.  Order for BP recheck by [facility], ordered on 12/28/2022.   BP Readings from Last 3 Encounters:  12/28/22 (!) 142/90  12/14/22 (!) 155/92  04/27/22 (!) 150/86    Current Outpatient Medications  Medication Sig Dispense Refill   atorvastatin (LIPITOR) 10 MG tablet Take 1 tablet (10 mg total) by mouth daily. 90 tablet 1   sildenafil (VIAGRA) 50 MG tablet Take 1 tablet (50 mg total) by mouth daily as needed for erectile dysfunction. 10 tablet 2   valsartan (DIOVAN) 160 MG tablet Take 1 tablet (160 mg total) by mouth daily. 90 tablet 3   No current facility-administered medications for this visit.    Hypertensive Medication Review: Patient states that they are taking all their hypertensive medications as prescribed and their last dose of hypertensive medications was this morning   Documentation of any medication adherence discrepancies: none  Provider Recommendation:  Spoke to Angus Seller and they stated: continue current mediation and follow up with provider in August.   Patient has been scheduled to follow up with 02/27/23   Patient has been given provider's recommendations and does not have any questions or concerns at this time. Patient will contact the office for any future questions or concerns.

## 2023-01-12 ENCOUNTER — Other Ambulatory Visit: Payer: Self-pay

## 2023-01-12 ENCOUNTER — Other Ambulatory Visit: Payer: 59

## 2023-01-12 ENCOUNTER — Other Ambulatory Visit: Payer: Self-pay | Admitting: Nurse Practitioner

## 2023-01-12 DIAGNOSIS — I10 Essential (primary) hypertension: Secondary | ICD-10-CM

## 2023-01-12 DIAGNOSIS — N182 Chronic kidney disease, stage 2 (mild): Secondary | ICD-10-CM

## 2023-01-12 NOTE — Progress Notes (Signed)
This encounter was created in error - please disregard.

## 2023-01-13 LAB — BASIC METABOLIC PANEL
BUN/Creatinine Ratio: 10 (ref 9–20)
BUN: 14 mg/dL (ref 6–24)
CO2: 24 mmol/L (ref 20–29)
Calcium: 9.2 mg/dL (ref 8.7–10.2)
Chloride: 101 mmol/L (ref 96–106)
Creatinine, Ser: 1.45 mg/dL — ABNORMAL HIGH (ref 0.76–1.27)
Glucose: 82 mg/dL (ref 70–99)
Potassium: 4.1 mmol/L (ref 3.5–5.2)
Sodium: 139 mmol/L (ref 134–144)
eGFR: 56 mL/min/{1.73_m2} — ABNORMAL LOW (ref >59)

## 2023-01-16 ENCOUNTER — Other Ambulatory Visit: Payer: Self-pay

## 2023-02-06 ENCOUNTER — Other Ambulatory Visit: Payer: Self-pay | Admitting: Nurse Practitioner

## 2023-02-06 MED ORDER — SILDENAFIL CITRATE 100 MG PO TABS: 100.0000 mg | ORAL_TABLET | ORAL | 1 refills | Status: DC | PRN

## 2023-02-27 ENCOUNTER — Ambulatory Visit: Payer: Self-pay | Admitting: Nurse Practitioner

## 2023-03-06 ENCOUNTER — Ambulatory Visit: Payer: 59 | Admitting: Nurse Practitioner

## 2023-03-10 ENCOUNTER — Ambulatory Visit (INDEPENDENT_AMBULATORY_CARE_PROVIDER_SITE_OTHER): Payer: 59 | Admitting: Nurse Practitioner

## 2023-03-10 ENCOUNTER — Encounter: Payer: Self-pay | Admitting: Nurse Practitioner

## 2023-03-10 VITALS — BP 137/83 | HR 82 | Ht 66.0 in | Wt 187.0 lb

## 2023-03-10 DIAGNOSIS — I10 Essential (primary) hypertension: Secondary | ICD-10-CM

## 2023-03-10 DIAGNOSIS — E785 Hyperlipidemia, unspecified: Secondary | ICD-10-CM | POA: Insufficient documentation

## 2023-03-10 DIAGNOSIS — F17210 Nicotine dependence, cigarettes, uncomplicated: Secondary | ICD-10-CM | POA: Diagnosis not present

## 2023-03-10 DIAGNOSIS — N182 Chronic kidney disease, stage 2 (mild): Secondary | ICD-10-CM

## 2023-03-10 DIAGNOSIS — E782 Mixed hyperlipidemia: Secondary | ICD-10-CM | POA: Diagnosis not present

## 2023-03-10 NOTE — Assessment & Plan Note (Signed)
BP Readings from Last 3 Encounters:  03/10/23 137/83  01/11/23 131/77  12/28/22 (!) 142/90  Fairly controlled on valsartan 160 mg daily, consider adding amlodipine at next visit if blood pressure reading is greater than 130/80 Continue current medications.  Discussed DASH diet and dietary sodium restrictions Continue to increase dietary efforts and exercise.

## 2023-03-10 NOTE — Assessment & Plan Note (Signed)
Lab Results  Component Value Date   NA 139 01/12/2023   K 4.1 01/12/2023   CO2 24 01/12/2023   GLUCOSE 82 01/12/2023   BUN 14 01/12/2023   CREATININE 1.45 (H) 01/12/2023   CALCIUM 9.2 01/12/2023   EGFR 56 (L) 01/12/2023   GFRNONAA 60 (L) 03/18/2022  Kidney function is slightly improved Patient encouraged to drink 64 ounces of water daily to maintain hydration avoid use of ibuprofen, Aleve Continue valsartan 160 mg daily

## 2023-03-10 NOTE — Assessment & Plan Note (Signed)
Smokes 3 to 4 cigarettes on some days Smoking cessation encouraged

## 2023-03-10 NOTE — Progress Notes (Signed)
Established Patient Office Visit  Subjective:  Patient ID: Timothy Harrington, male    DOB: 04/05/1966  Age: 57 y.o. MRN: [id number]  CC:  Chief Complaint  Patient presents with   Hypertension    HPI Timothy Harrington is a 57 y.o. male  has a past medical history of Hyperlipidemia LDL goal <100, Hypertension, and Kidney stones.  Patient presents for follow-up for his chronic medical conditions  Hypertension.  Currently on valsartan 160 mg daily, reports home blood pressure readings of 130s over 80s.  No complaints of chest pain dizziness edema.  States that he has been following a low-sodium diet at home but needs to start exercising.  Hyperlipidemia.  Started taking atorvastatin 10 mg daily about a week ago.  No complaints of muscle aches  Patient encouraged to get his Cologuard test done as ordered.  Encouraged to get Tdap vaccine and shingles vaccine at the pharmacy    Past Medical History:  Diagnosis Date   Hyperlipidemia LDL goal <100    Hypertension    Kidney stones     Past Surgical History:  Procedure Laterality Date   KIDNEY STONE SURGERY      Family History  Problem Relation Age of Onset   Stomach cancer Mother    Diabetes Mother    Kidney disease Father    Heart disease Sister    Cancer Neg Hx    Hypertension Neg Hx     Social History   Socioeconomic History   Marital status: Legally Separated    Spouse name: Not on file   Number of children: 3   Years of education: Not on file   Highest education level: Some college, no degree  Occupational History   Not on file  Tobacco Use   Smoking status: Every Day    Current packs/day: 0.10    Types: Cigarettes   Smokeless tobacco: Never   Tobacco comments:    2 cigs per day  Substance and Sexual Activity   Alcohol use: Yes    Comment: 3 drinks on some days.   Drug use: Yes    Frequency: 3.0 times per week    Comment: Marijuana hx   Sexual activity: Yes    Birth control/protection: None  Other Topics  Concern   Not on file  Social History Narrative   Right handed. Lives wit his daughter.    Social Determinants of Health   Financial Resource Strain: Low Risk  (12/28/2022)   Overall Financial Resource Strain (CARDIA)    Difficulty of Paying Living Expenses: Not hard at all  Food Insecurity: No Food Insecurity (12/28/2022)   Hunger Vital Sign    Worried About Running Out of Food in the Last Year: Never true    Ran Out of Food in the Last Year: Never true  Transportation Needs: No Transportation Needs (12/28/2022)   PRAPARE - Administrator, Civil Service (Medical): No    Lack of Transportation (Non-Medical): No  Physical Activity: Insufficiently Active (12/28/2022)   Exercise Vital Sign    Days of Exercise per Week: 3 days    Minutes of Exercise per Session: 30 min  Stress: No Stress Concern Present (12/28/2022)   [employer] of Occupational Health - Occupational Stress Questionnaire    Feeling of Stress : Only a little  Social Connections: Moderately Isolated (12/28/2022)   Social Connection and Isolation Panel [NHANES]    Frequency of Communication with Friends and Family: More than three times a week  Frequency of Social Gatherings with Friends and Family: Three times a week    Attends Religious Services: 1 to 4 times per year    Active Member of Clubs or Organizations: No    Attends Engineer, structural: Not on file    Marital Status: Separated  Intimate Partner Violence: Not on file    Outpatient Medications Prior to Visit  Medication Sig Dispense Refill   atorvastatin (LIPITOR) 10 MG tablet Take 1 tablet (10 mg total) by mouth daily. 90 tablet 1   sildenafil (VIAGRA) 100 MG tablet Take 1 tablet (100 mg total) by mouth as needed for erectile dysfunction. 10 tablet 1   valsartan (DIOVAN) 160 MG tablet Take 1 tablet (160 mg total) by mouth daily. 90 tablet 3   No facility-administered medications prior to visit.    No Known Allergies  ROS Review of  Systems  Constitutional:  Negative for activity change, appetite change, chills, diaphoresis, fatigue, fever and unexpected weight change.  HENT:  Negative for congestion, dental problem, drooling and ear discharge.   Eyes:  Negative for pain, discharge, redness and itching.  Respiratory:  Negative for apnea, cough, choking, chest tightness, shortness of breath and wheezing.   Cardiovascular: Negative.  Negative for chest pain, palpitations and leg swelling.  Gastrointestinal:  Negative for abdominal distention, abdominal pain, anal bleeding, blood in stool, constipation, diarrhea and vomiting.  Endocrine: Negative for polydipsia, polyphagia and polyuria.  Genitourinary:  Negative for difficulty urinating, flank pain, frequency and genital sores.  Musculoskeletal: Negative.  Negative for arthralgias, back pain, gait problem and joint swelling.  Skin:  Negative for color change, pallor and rash.  Neurological:  Negative for dizziness, facial asymmetry, light-headedness, numbness and headaches.  Psychiatric/Behavioral:  Negative for agitation, behavioral problems, confusion, hallucinations, self-injury, sleep disturbance and suicidal ideas.       Objective:    Physical Exam Vitals and nursing note reviewed.  Constitutional:      General: He is not in acute distress.    Appearance: Normal appearance. He is not ill-appearing, toxic-appearing or diaphoretic.  HENT:     Mouth/Throat:     Mouth: Mucous membranes are moist.     Pharynx: Oropharynx is clear. No oropharyngeal exudate or posterior oropharyngeal erythema.  Eyes:     General: No scleral icterus.       Right eye: No discharge.        Left eye: No discharge.     Extraocular Movements: Extraocular movements intact.     Conjunctiva/sclera: Conjunctivae normal.  Cardiovascular:     Rate and Rhythm: Normal rate and regular rhythm.     Pulses: Normal pulses.     Heart sounds: Normal heart sounds. No murmur heard.    No friction rub.  No gallop.  Pulmonary:     Effort: Pulmonary effort is normal. No respiratory distress.     Breath sounds: Normal breath sounds. No stridor. No wheezing, rhonchi or rales.  Chest:     Chest wall: No tenderness.  Abdominal:     General: There is no distension.     Palpations: Abdomen is soft.     Tenderness: There is no abdominal tenderness. There is no right CVA tenderness, left CVA tenderness or guarding.  Musculoskeletal:        General: No swelling, tenderness, deformity or signs of injury.     Right lower leg: No edema.     Left lower leg: No edema.  Skin:    General: Skin is warm  and dry.     Capillary Refill: Capillary refill takes less than 2 seconds.     Coloration: Skin is not jaundiced or pale.     Findings: No bruising, erythema or lesion.  Neurological:     Mental Status: He is alert and oriented to person, place, and time.     Motor: No weakness.     Coordination: Coordination normal.     Gait: Gait normal.  Psychiatric:        Mood and Affect: Mood normal.        Behavior: Behavior normal.        Thought Content: Thought content normal.        Judgment: Judgment normal.     BP 137/83   Pulse 82   Ht 5\' 6"  (1.676 m)   Wt 187 lb (84.8 kg)   SpO2 98%   BMI 30.18 kg/m  Wt Readings from Last 3 Encounters:  03/10/23 187 lb (84.8 kg)  01/11/23 189 lb 3.2 oz (85.8 kg)  12/28/22 186 lb 6.4 oz (84.6 kg)    Lab Results  Component Value Date   TSH 1.550 12/28/2022   Lab Results  Component Value Date   WBC 6.4 12/28/2022   HGB 15.4 12/28/2022   HCT 45.6 12/28/2022   MCV 84 12/28/2022   PLT 196 12/28/2022   Lab Results  Component Value Date   NA 139 01/12/2023   K 4.1 01/12/2023   CO2 24 01/12/2023   GLUCOSE 82 01/12/2023   BUN 14 01/12/2023   CREATININE 1.45 (H) 01/12/2023   BILITOT 1.4 (H) 12/28/2022   ALKPHOS 72 12/28/2022   AST 22 12/28/2022   ALT 20 12/28/2022   PROT 7.9 12/28/2022   ALBUMIN 4.5 12/28/2022   CALCIUM 9.2 01/12/2023    ANIONGAP 11 03/18/2022   EGFR 56 (L) 01/12/2023   Lab Results  Component Value Date   CHOL 267 (H) 12/28/2022   Lab Results  Component Value Date   HDL 56 12/28/2022   Lab Results  Component Value Date   LDLCALC 164 (H) 12/28/2022   Lab Results  Component Value Date   TRIG 253 (H) 12/28/2022   Lab Results  Component Value Date   CHOLHDL 4.8 12/28/2022   Lab Results  Component Value Date   HGBA1C 5.1 04/27/2022   HGBA1C 5.1 04/27/2022   HGBA1C 5.1 (A) 04/27/2022   HGBA1C 5.1 04/27/2022      Assessment & Plan:   Problem List Items Addressed This Visit       Cardiovascular and Mediastinum   High blood pressure    BP Readings from Last 3 Encounters:  03/10/23 137/83  01/11/23 131/77  12/28/22 (!) 142/90  Fairly controlled on valsartan 160 mg daily, consider adding amlodipine at next visit if blood pressure reading is greater than 130/80 Continue current medications.  Discussed DASH diet and dietary sodium restrictions Continue to increase dietary efforts and exercise.           Genitourinary   CKD (chronic kidney disease) stage 2, GFR 60-89 ml/min - Primary    Lab Results  Component Value Date   NA 139 01/12/2023   K 4.1 01/12/2023   CO2 24 01/12/2023   GLUCOSE 82 01/12/2023   BUN 14 01/12/2023   CREATININE 1.45 (H) 01/12/2023   CALCIUM 9.2 01/12/2023   EGFR 56 (L) 01/12/2023   GFRNONAA 60 (L) 03/18/2022  Kidney function is slightly improved Patient encouraged to drink 64 ounces of water daily to  maintain hydration avoid use of ibuprofen, Aleve Continue valsartan 160 mg daily        Other   Cigarette smoker    Smokes 3 to 4 cigarettes on some days Smoking cessation encouraged       Hyperlipidemia    Lab Results  Component Value Date   CHOL 267 (H) 12/28/2022   HDL 56 12/28/2022   LDLCALC 164 (H) 12/28/2022   TRIG 253 (H) 12/28/2022   CHOLHDL 4.8 12/28/2022   The 10-year ASCVD risk score (Arnett DK, et al., 2019) is: 25%   Values used  to calculate the score:     Age: 68 years     Sex: Male     Is Non-Hispanic African American: Yes     Diabetic: No     Tobacco smoker: Yes     Systolic Blood Pressure: 137 mmHg     Is BP treated: Yes     HDL Cholesterol: 56 mg/dL     Total Cholesterol: 267 mg/dL   Continue atorvastatin 10 mg daily will check lipid panel at next visit        No orders of the defined types were placed in this encounter.   Follow-up: Return in about 2 months (around 05/10/2023) for HTN, HYPERLIPIDEMIA.    Donell Beers, FNP

## 2023-03-10 NOTE — Patient Instructions (Addendum)
Please get your Tdap vaccine and shingles vaccine at the pharmacy Please get your colon cancer screening done as discussed  Take atorvastatin 10 mg daily for your high cholesterol Continue valsartan 160 mg daily for hypertension  Please come fasting for your next appointment so we can recheck your cholesterol level  It is important that you exercise regularly at least 30 minutes 5 times a week as tolerated  Think about what you will eat, plan ahead. Choose " clean, green, fresh or frozen" over canned, processed or packaged foods which are more sugary, salty and fatty. 70 to 75% of food eaten should be vegetables and fruit. Three meals at set times with snacks allowed between meals, but they must be fruit or vegetables. Aim to eat over a 12 hour period , example 7 am to 7 pm, and STOP after  your last meal of the day. Drink water,generally about 64 ounces per day, no other drink is as healthy. Fruit juice is best enjoyed in a healthy way, by EATING the fruit.  Thanks for choosing [facility] we consider it a privelige to serve you.

## 2023-03-10 NOTE — Assessment & Plan Note (Signed)
Lab Results  Component Value Date   CHOL 267 (H) 12/28/2022   HDL 56 12/28/2022   LDLCALC 164 (H) 12/28/2022   TRIG 253 (H) 12/28/2022   CHOLHDL 4.8 12/28/2022   The 10-year ASCVD risk score (Arnett DK, et al., 2019) is: 25%   Values used to calculate the score:     Age: 57 years     Sex: Male     Is Non-Hispanic African American: Yes     Diabetic: No     Tobacco smoker: Yes     Systolic Blood Pressure: 137 mmHg     Is BP treated: Yes     HDL Cholesterol: 56 mg/dL     Total Cholesterol: 267 mg/dL   Continue atorvastatin 10 mg daily will check lipid panel at next visit

## 2023-03-14 ENCOUNTER — Other Ambulatory Visit: Payer: Self-pay | Admitting: Nurse Practitioner

## 2023-03-14 DIAGNOSIS — N529 Male erectile dysfunction, unspecified: Secondary | ICD-10-CM

## 2023-03-14 MED ORDER — SILDENAFIL CITRATE 100 MG PO TABS: 100.0000 mg | ORAL_TABLET | ORAL | 1 refills | Status: DC | PRN

## 2023-04-09 ENCOUNTER — Other Ambulatory Visit: Payer: Self-pay | Admitting: Nurse Practitioner

## 2023-04-09 DIAGNOSIS — I10 Essential (primary) hypertension: Secondary | ICD-10-CM

## 2023-04-21 ENCOUNTER — Telehealth: Payer: Self-pay | Admitting: Nurse Practitioner

## 2023-04-21 ENCOUNTER — Other Ambulatory Visit: Payer: Self-pay | Admitting: Nurse Practitioner

## 2023-04-21 DIAGNOSIS — N529 Male erectile dysfunction, unspecified: Secondary | ICD-10-CM

## 2023-04-21 MED ORDER — SILDENAFIL CITRATE 100 MG PO TABS: 100.0000 mg | ORAL_TABLET | ORAL | 1 refills | Status: DC | PRN

## 2023-04-21 NOTE — Telephone Encounter (Signed)
Caller & Relationship to patient:   MRN #  [id number]   Call Back Number:   Date of Last Office Visit: 04/09/2023     Date of Next Office Visit: 05/12/2023    Medication(s) to be Refilled: Viagra 100mg   Preferred Pharmacy:   ** Please notify patient to allow 48-72 hours to process** **Let patient know to contact pharmacy at the end of the day to make sure medication is ready. ** **If patient has not been seen in a year or longer, book an appointment **Advise to use MyChart for refill requests OR to contact their pharmacy

## 2023-04-28 ENCOUNTER — Other Ambulatory Visit: Payer: Self-pay | Admitting: Nurse Practitioner

## 2023-04-28 DIAGNOSIS — Z1212 Encounter for screening for malignant neoplasm of rectum: Secondary | ICD-10-CM

## 2023-04-28 DIAGNOSIS — Z1211 Encounter for screening for malignant neoplasm of colon: Secondary | ICD-10-CM

## 2023-05-12 ENCOUNTER — Ambulatory Visit: Payer: Self-pay | Admitting: Nurse Practitioner

## 2023-06-08 ENCOUNTER — Other Ambulatory Visit: Payer: Self-pay

## 2023-06-08 DIAGNOSIS — N529 Male erectile dysfunction, unspecified: Secondary | ICD-10-CM

## 2023-06-08 NOTE — Telephone Encounter (Signed)
Please advise KH 

## 2023-06-09 ENCOUNTER — Other Ambulatory Visit: Payer: Self-pay | Admitting: Nurse Practitioner

## 2023-06-09 DIAGNOSIS — N529 Male erectile dysfunction, unspecified: Secondary | ICD-10-CM

## 2023-06-09 MED ORDER — SILDENAFIL CITRATE 100 MG PO TABS: 100.0000 mg | ORAL_TABLET | ORAL | 1 refills | Status: DC | PRN

## 2023-07-18 ENCOUNTER — Other Ambulatory Visit: Payer: Self-pay

## 2023-07-18 ENCOUNTER — Other Ambulatory Visit: Payer: Self-pay | Admitting: Nurse Practitioner

## 2023-07-18 DIAGNOSIS — I10 Essential (primary) hypertension: Secondary | ICD-10-CM

## 2023-07-18 DIAGNOSIS — E785 Hyperlipidemia, unspecified: Secondary | ICD-10-CM

## 2023-07-18 MED ORDER — VALSARTAN 160 MG PO TABS: 160.0000 mg | ORAL_TABLET | Freq: Every day | ORAL | 3 refills | Status: DC

## 2023-07-18 MED ORDER — ATORVASTATIN CALCIUM 10 MG PO TABS: 10.0000 mg | ORAL_TABLET | Freq: Every day | ORAL | 1 refills | Status: DC

## 2023-07-18 NOTE — Telephone Encounter (Signed)
Please advise Kh

## 2023-07-20 ENCOUNTER — Other Ambulatory Visit: Payer: Self-pay | Admitting: Nurse Practitioner

## 2023-07-20 DIAGNOSIS — N529 Male erectile dysfunction, unspecified: Secondary | ICD-10-CM

## 2023-07-20 MED ORDER — SILDENAFIL CITRATE 100 MG PO TABS: 100.0000 mg | ORAL_TABLET | ORAL | 1 refills | Status: DC | PRN

## 2023-07-20 NOTE — Telephone Encounter (Signed)
Please advise due to [facility] absence. Thank you.

## 2023-07-20 NOTE — Telephone Encounter (Signed)
Copied from CRM [id number]. Topic: Clinical - Medication Refill >> Jul 20, 2023 12:12 PM Shelah Lewandowsky wrote: Most Recent Primary Care Visit:  Provider: Donell Beers  Department: [facility]  Visit Type: OFFICE VISIT  Date: 03/10/2023  Medication: sildenafil (VIAGRA)   Has the patient contacted their pharmacy? Yes (Agent: If no, request that the patient contact the pharmacy for the refill. If patient does not wish to contact the pharmacy document the reason why and proceed with request.) (Agent: If yes, when and what did the pharmacy advise?)  Is this the correct pharmacy for this prescription? Yes If no, delete pharmacy and type the correct one.  This is the patient's preferred pharmacy:  [facility]  [place] -  [address] [place], [place] [address] Phone: [phone]   Has the prescription been filled recently? Yes  Is the patient out of the medication? Yes  Has the patient been seen for an appointment in the last year OR does the patient have an upcoming appointment? Yes  Can we respond through MyChart? Yes  Agent: Please be advised that Rx refills may take up to 3 business days. We ask that you follow-up with your pharmacy.

## 2024-01-04 ENCOUNTER — Other Ambulatory Visit: Payer: Self-pay

## 2024-01-04 DIAGNOSIS — N529 Male erectile dysfunction, unspecified: Secondary | ICD-10-CM

## 2024-01-04 MED ORDER — SILDENAFIL CITRATE 100 MG PO TABS: 100.0000 mg | ORAL_TABLET | ORAL | 1 refills | Status: DC | PRN

## 2024-01-05 ENCOUNTER — Other Ambulatory Visit: Payer: Self-pay | Admitting: Nurse Practitioner

## 2024-01-05 DIAGNOSIS — N529 Male erectile dysfunction, unspecified: Secondary | ICD-10-CM

## 2024-01-05 MED ORDER — SILDENAFIL CITRATE 100 MG PO TABS: 100.0000 mg | ORAL_TABLET | ORAL | 1 refills | Status: DC | PRN

## 2024-01-28 ENCOUNTER — Encounter (HOSPITAL_COMMUNITY): Payer: Self-pay | Admitting: *Deleted

## 2024-01-28 ENCOUNTER — Other Ambulatory Visit: Payer: Self-pay

## 2024-01-28 ENCOUNTER — Emergency Department (HOSPITAL_COMMUNITY)

## 2024-01-28 ENCOUNTER — Ambulatory Visit
Admission: EM | Admit: 2024-01-28 | Discharge: 2024-01-28 | Disposition: A | Discharge status: Other Acute Inpatient Hospital

## 2024-01-28 ENCOUNTER — Emergency Department (HOSPITAL_COMMUNITY)
Admission: EM | Admit: 2024-01-28 | Discharge: 2024-01-28 | Disposition: A | Discharge status: Home / Self Care | Attending: Emergency Medicine | Admitting: Emergency Medicine

## 2024-01-28 DIAGNOSIS — I160 Hypertensive urgency: Secondary | ICD-10-CM

## 2024-01-28 DIAGNOSIS — Z79899 Other long term (current) drug therapy: Secondary | ICD-10-CM | POA: Diagnosis not present

## 2024-01-28 DIAGNOSIS — F17210 Nicotine dependence, cigarettes, uncomplicated: Secondary | ICD-10-CM | POA: Diagnosis not present

## 2024-01-28 DIAGNOSIS — R079 Chest pain, unspecified: Secondary | ICD-10-CM

## 2024-01-28 DIAGNOSIS — I10 Essential (primary) hypertension: Secondary | ICD-10-CM | POA: Insufficient documentation

## 2024-01-28 LAB — CBC
HCT: 43.9 % (ref 39.0–52.0)
Hemoglobin: 16 g/dL (ref 13.0–17.0)
MCH: 28.7 pg (ref 26.0–34.0)
MCHC: 36.4 g/dL — ABNORMAL HIGH (ref 30.0–36.0)
MCV: 78.7 fL — ABNORMAL LOW (ref 80.0–100.0)
Platelets: 206 K/uL (ref 150–400)
RBC: 5.58 MIL/uL (ref 4.22–5.81)
RDW: 13.2 % (ref 11.5–15.5)
WBC: 7.7 K/uL (ref 4.0–10.5)
nRBC: 0 % (ref 0.0–0.2)

## 2024-01-28 LAB — BASIC METABOLIC PANEL WITH GFR
Anion gap: 9 (ref 5–15)
BUN: 12 mg/dL (ref 6–20)
CO2: 25 mmol/L (ref 22–32)
Calcium: 9.6 mg/dL (ref 8.9–10.3)
Chloride: 104 mmol/L (ref 98–111)
Creatinine, Ser: 1.29 mg/dL — ABNORMAL HIGH (ref 0.61–1.24)
GFR, Estimated: >60 mL/min (ref >60)
Glucose, Bld: 100 mg/dL — ABNORMAL HIGH (ref 70–99)
Potassium: 4.1 mmol/L (ref 3.5–5.1)
Sodium: 138 mmol/L (ref 135–145)

## 2024-01-28 LAB — TROPONIN I (HIGH SENSITIVITY): Troponin I (High Sensitivity): 6 ng/L (ref <18)

## 2024-01-28 NOTE — ED Notes (Signed)
 Reviewed D/C information with the patient, pt verbalized understanding. No additional concerns at this time.

## 2024-01-28 NOTE — ED Triage Notes (Signed)
 My BP was randomly high when I took it at [facility] (194/over something), then I noticed some pain in my right leg (lower). No injury but I did got out and play basketball recently.

## 2024-01-28 NOTE — Discharge Instructions (Signed)

## 2024-01-28 NOTE — ED Notes (Signed)
 Patient is being discharged from the Urgent Care and sent to the Emergency Department via POV . Per RM, patient is in need of higher level of care due to hypertension/CP. Patient is aware and verbalizes understanding of plan of care.  Vitals:   01/28/24 1424  BP: (S) (!) 192/104  Pulse: 65  Resp: 18  Temp: 98.6 F (37 C)  SpO2: 97%

## 2024-01-28 NOTE — ED Provider Notes (Signed)
 [place] [facility] AT [facility] Provider Note   CSN: [id number] Arrival date & time: 01/28/24  1552     Patient presents with: Chest Pain and Hypertension   Timothy Harrington is a 58 y.o. male.   HPI     58 year old male comes in with chief complaint of elevated blood pressure.  Patient has history of hypertension.  States that he is pretty good about taking his blood pressure medication, but might miss doses here and there.  Patient smokes, uses alcohol but denies any heart drug use.  Patient states that he was feeling slightly sluggish the last couple of days.  He decided to check his blood pressure today and the systolic was over 190.  He went to the urgent care, and they advised he come to the ER.  Patient did have some chest discomfort intermittently.  He denies any shortness of breath.  Chest pain is intermittent, unprovoked and with no specific aggravating or relieving factor.  Pain is described as pressure.  Patient has no history of coronary artery disease or any stress test recently.  Prior to Admission medications   Medication Sig Start Date End Date Taking? Authorizing Provider  atorvastatin  (LIPITOR) 10 MG tablet Take 1 tablet (10 mg total) by mouth daily. 07/18/23   Paseda, Folashade R, FNP  ibuprofen  (ADVIL ) 200 MG tablet Take 200 mg by mouth every 6 (six) hours as needed.    [provider]  sildenafil  (VIAGRA ) 100 MG tablet Take 1 tablet (100 mg total) by mouth as needed for erectile dysfunction. 01/05/24 01/04/25  Paseda, Folashade R, FNP  valsartan  (DIOVAN ) 160 MG tablet Take 1 tablet (160 mg total) by mouth daily. 07/18/23   Paseda, Folashade R, FNP    Allergies: Patient has no known allergies.    Review of Systems  All other systems reviewed and are negative.   Updated Vital Signs BP (!) 168/100   Pulse 92   Temp 98.4 F (36.9 C)   Resp 19   Ht 5' 9 (1.753 m)   Wt 83.9 kg   SpO2 100%   BMI 27.31 kg/m   Physical  Exam Vitals and nursing note reviewed.  Constitutional:      Appearance: He is well-developed.  HENT:     Head: Atraumatic.  Cardiovascular:     Rate and Rhythm: Normal rate.  Pulmonary:     Effort: Pulmonary effort is normal.  Musculoskeletal:     Cervical back: Neck supple.     Right lower leg: Tenderness present. No edema.     Left lower leg: No tenderness. No edema.     Comments: right anterior shin tenderness  Skin:    General: Skin is warm.  Neurological:     Mental Status: He is alert and oriented to person, place, and time.     (all labs ordered are listed, but only abnormal results are displayed) Labs Reviewed  BASIC METABOLIC PANEL WITH GFR - Abnormal; Notable for the following components:      Result Value   Glucose, Bld 100 (*)    Creatinine, Ser 1.29 (*)    All other components within normal limits  CBC - Abnormal; Notable for the following components:   MCV 78.7 (*)    MCHC 36.4 (*)    All other components within normal limits  TROPONIN I (HIGH SENSITIVITY)    EKG: EKG Interpretation Date/Time:  Sunday January 28 2024 16:16:13 EDT Ventricular Rate:  61 PR Interval:  158  QRS Duration:  86 QT Interval:  388 QTC Calculation: 390 R Axis:   72  Text Interpretation: Sinus rhythm with Premature atrial complexes Minimal voltage criteria for LVH, may be normal variant ( Sokolow-Lyon ) Borderline ECG When compared with ECG of 18-Mar-2022 18:52, PREVIOUS ECG IS PRESENT No significant change since last tracing Confirmed by Charlyn Sora [phone]) on 01/28/2024 5:09:19 PM  Radiology: [facility] Chest 2 View Result Date: 01/28/2024 CLINICAL DATA:  Chest pain hypertension EXAM: CHEST - 2 VIEW COMPARISON:  03/18/2022 FINDINGS: The heart size and mediastinal contours are within normal limits. Both lungs are clear. The visualized skeletal structures are unremarkable. IMPRESSION: No active cardiopulmonary disease. Electronically Signed   By: Luke Bun M.D.   On: 01/28/2024 16:52      Procedures   Medications Ordered in the ED - No data to display                                  Medical Decision Making  58 year old male comes in with chief complaint of elevated blood pressure.  Patient has history of hypertension, tobacco use disorder. He states that he is better now about taking his medications, but might miss a dose here and there.  He checked his BP today as he was feeling sluggish and it was over 190, prompting him to go to urgent care.  He was sent to the ER from there.  He has had some off-and-on chest pain over the last 2 days.  He has had some off-and-on headache as well.  Currently he is chest pain-free and headache free.  Patient also complained of some right leg pain and right hip pain.  Differential diagnosis for this patient includes uncontrolled hypertension, asymptomatic hypertension, hypertensive emergency, ACS.  Musculoskeletal exam is normal.  No concerns for DVT.  Patient's BP in the ER is high, but his EKG is reassuring, cardiac workup is reassuring.  BP has been in the 160s to 180s range systolic here. Pt admits to missing diovan  today.  Advised that patient start his medications.  We will not be giving him any IV BP meds.  I told him that there is room for his BP medications to go up, so it is prudent that he start taking his BP medication and keep a BP log and follows up with PCP next week.  Return precautions discussed.  Advised that his musculoskeletal and hip pain can be also discussed with PCP    Final diagnoses:  Uncontrolled hypertension    ED Discharge Orders     None          Charlyn Sora, MD 01/28/24 2009

## 2024-01-28 NOTE — ED Triage Notes (Signed)
 The pt has had chest pain since yesterday and he went to a urgent care  and his bp was high and he needed to come to the hospital

## 2024-01-28 NOTE — ED Provider Notes (Signed)
 [facility]    CSN: [id number] Arrival date & time: 01/28/24  1346      History   Chief Complaint Chief Complaint  Patient presents with   Hypertension   Pain    HPI Timothy Harrington is a 58 y.o. male.   Patient here today for evaluation of elevated blood pressure that he first noticed today when he took it at [facility].  He states he also has had some pain in his lower leg.  He denies any known injury.  He notes he did have chest pain yesterday but has not had any today.  He reports some headaches at times.  He is currently taking blood pressure medication but has not been consistent with that admittedly.  He does state that he did take medication today however.  The history is provided by the patient.  Hypertension Associated symptoms include chest pain and headaches. Pertinent negatives include no shortness of breath.    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 Authorizing Provider  atorvastatin  (LIPITOR) 10 MG tablet Take 1 tablet (10 mg total) by mouth daily. 07/18/23  Yes Paseda, Folashade R, FNP  ibuprofen  (ADVIL ) 200 MG tablet Take 200 mg by  mouth every 6 (six) hours as needed.   Yes [provider]  sildenafil  (VIAGRA ) 100 MG tablet Take 1 tablet (100 mg total) by mouth as needed for erectile dysfunction. 01/05/24 01/04/25 Yes Paseda, Folashade R, FNP  valsartan  (DIOVAN ) 160 MG tablet Take 1 tablet (160 mg total) by mouth daily. 07/18/23  Yes Paseda, Folashade R, FNP    Family History Family History  Problem Relation Age of Onset   Stomach cancer Mother    Diabetes Mother    Kidney disease Father    Heart disease Sister    Cancer Neg Hx    Hypertension Neg Hx     Social History Social History   Tobacco Use   Smoking status: Every Day    Current packs/day: 0.10    Types: Cigarettes   Smokeless tobacco: Never   Tobacco comments:    2 cigs per day  Vaping Use   Vaping status: Never Used  Substance Use Topics   Alcohol use: Yes    Comment: Occassionally.   Drug use: Yes    Frequency: 3.0 times per week    Comment: Sometimes     Allergies   Patient has no known allergies.   Review of Systems Review of Systems  Constitutional:  Negative for chills and fever.  Eyes:  Negative for discharge and redness.  Respiratory:  Negative for shortness of breath.   Cardiovascular:  Positive  for chest pain.  Gastrointestinal:  Negative for vomiting.  Neurological:  Positive for headaches. Negative for numbness.     Physical Exam Triage Vital Signs ED Triage Vitals  Encounter Vitals Group     BP 01/28/24 1424 (S) (!) 192/104     Girls Systolic BP Percentile --      Girls Diastolic BP Percentile --      Boys Systolic BP Percentile --      Boys Diastolic BP Percentile --      Pulse Rate 01/28/24 1424 65     Resp 01/28/24 1424 18     Temp 01/28/24 1424 98.6 F (37 C)     Temp Source 01/28/24 1424 Oral     SpO2 01/28/24 1424 97 %     Weight 01/28/24 1422 185 lb (83.9 kg)     Height 01/28/24 1422 5' 9 (1.753 m)     Head Circumference --      Peak Flow --      Pain Score 01/28/24 1419 0     Pain Loc  --      Pain Education --      Exclude from Growth Chart --    No data found.  Updated Vital Signs BP (S) (!) 192/104 (BP Location: Left Arm)   Pulse 65   Temp 98.6 F (37 C) (Oral)   Resp 18   Ht 5' 9 (1.753 m)   Wt 185 lb (83.9 kg)   SpO2 97%   BMI 27.32 kg/m   Visual Acuity Right Eye Distance:   Left Eye Distance:   Bilateral Distance:    Right Eye Near:   Left Eye Near:    Bilateral Near:     Physical Exam Vitals and nursing note reviewed.  Constitutional:      General: He is not in acute distress.    Appearance: Normal appearance. He is not ill-appearing.  HENT:     Head: Normocephalic and atraumatic.  Eyes:     Conjunctiva/sclera: Conjunctivae normal.  Cardiovascular:     Rate and Rhythm: Normal rate and regular rhythm.  Pulmonary:     Effort: Pulmonary effort is normal. No respiratory distress.     Breath sounds: No wheezing, rhonchi or rales.  Neurological:     Mental Status: He is alert.  Psychiatric:        Mood and Affect: Mood normal.        Behavior: Behavior normal.        Thought Content: Thought content normal.      UC Treatments / Results  Labs (all labs ordered are listed, but only abnormal results are displayed) Labs Reviewed - No data to display  EKG   Radiology No results found.  Procedures Procedures (including critical care time)  Medications Ordered in UC Medications - No data to display  Initial Impression / Assessment and Plan / UC Course  I have reviewed the triage vital signs and the nursing notes.  Pertinent labs & imaging results that were available during my care of the patient were reviewed by me and considered in my medical decision making (see chart for details).    Given elevated blood pressure with reported chest pain yesterday recommended further evaluation in the emergency room.  Patient is agreeable.  Will transport via POV.  Final Clinical Impressions(s) / UC Diagnoses   Final diagnoses:   Hypertensive urgency  Chest pain, unspecified type   Discharge Instructions   None    ED Prescriptions  None    PDMP not reviewed this encounter.   Billy Asberry FALCON, PA-C 01/28/24 1524

## 2024-01-30 ENCOUNTER — Telehealth: Payer: Self-pay

## 2024-01-30 NOTE — Transitions of Care (Post Inpatient/ED Visit) (Signed)
   01/30/2024  Name: Timothy Harrington MRN: [id number] DOB: [birth year]  Today's TOC FU Call Status:   Patient's Name and Date of Birth confirmed.  Transition Care Management Follow-up Telephone Call Date of Discharge: 01/28/24 Discharge Facility: [facility]) Type of Discharge: Emergency Department Reason for ED Visit: Other: How have you been since you were released from the hospital?: Better Any questions or concerns?: No  Items Reviewed: Did you receive and understand the discharge instructions provided?: Yes Medications obtained,verified, and reconciled?: Yes (Medications Reviewed) Any new allergies since your discharge?: No Dietary orders reviewed?: NA Do you have support at home?: Yes People in Home [RPT]: alone, child(ren), adult  Medications Reviewed Today: Medications Reviewed Today     Reviewed by [name], CMA (Certified Medical Assistant) on 01/30/24 at 1054  Med List Status: <None>   Medication Order Taking? Sig Documenting Provider Last Dose Status Informant  atorvastatin  (LIPITOR) 10 MG tablet [id number] Yes Take 1 tablet (10 mg total) by mouth daily. Paseda, Folashade R, FNP  Active   ibuprofen  (ADVIL ) 200 MG tablet [id number]  Take 200 mg by mouth every 6 (six) hours as needed.  Patient not taking: Reported on 01/30/2024   [provider]  Active   sildenafil  (VIAGRA ) 100 MG tablet [id number] Yes Take 1 tablet (100 mg total) by mouth as needed for erectile dysfunction. Paseda, Folashade R, FNP  Active   valsartan  (DIOVAN ) 160 MG tablet [id number] Yes Take 1 tablet (160 mg total) by mouth daily. Paseda, Folashade R, FNP  Active   Med List Note [name] 07/06/12 9189): No pharmacy preference            Home Care and Equipment/Supplies: Were Home Health Services Ordered?: NA Any new equipment or medical supplies ordered?: NA  Functional Questionnaire: Do you need assistance with bathing/showering or dressing?: No Do you need  assistance with meal preparation?: No Do you need assistance with eating?: No Do you have difficulty maintaining continence: No Do you need assistance with getting out of bed/getting out of a chair/moving?: No Do you have difficulty managing or taking your medications?: No  Follow up appointments reviewed: Specialist Hospital Follow-up appointment confirmed?: NA Do you need transportation to your follow-up appointment?: No Do you understand care options if your condition(s) worsen?: Yes-patient verbalized understanding  SDOH Interventions Today    Flowsheet Row Most Recent Value  SDOH Interventions   Food Insecurity Interventions Intervention Not Indicated  Housing Interventions Intervention Not Indicated  Transportation Interventions Intervention Not Indicated    SIGNATURE [name], RMA

## 2024-02-12 ENCOUNTER — Encounter: Payer: Self-pay | Admitting: Nurse Practitioner

## 2024-02-12 ENCOUNTER — Ambulatory Visit (INDEPENDENT_AMBULATORY_CARE_PROVIDER_SITE_OTHER): Payer: Self-pay | Admitting: Nurse Practitioner

## 2024-02-12 VITALS — BP 126/77 | HR 98 | Wt 186.0 lb

## 2024-02-12 DIAGNOSIS — N182 Chronic kidney disease, stage 2 (mild): Secondary | ICD-10-CM

## 2024-02-12 DIAGNOSIS — G8929 Other chronic pain: Secondary | ICD-10-CM

## 2024-02-12 DIAGNOSIS — F17210 Nicotine dependence, cigarettes, uncomplicated: Secondary | ICD-10-CM | POA: Diagnosis not present

## 2024-02-12 DIAGNOSIS — E785 Hyperlipidemia, unspecified: Secondary | ICD-10-CM

## 2024-02-12 DIAGNOSIS — M545 Low back pain, unspecified: Secondary | ICD-10-CM | POA: Insufficient documentation

## 2024-02-12 DIAGNOSIS — I10 Essential (primary) hypertension: Secondary | ICD-10-CM | POA: Diagnosis not present

## 2024-02-12 DIAGNOSIS — Z09 Encounter for follow-up examination after completed treatment for conditions other than malignant neoplasm: Secondary | ICD-10-CM

## 2024-02-12 MED ORDER — LIDOCAINE 5 % EX PTCH: 1.0000 | MEDICATED_PATCH | CUTANEOUS | 0 refills | Status: DC

## 2024-02-12 MED ORDER — VALSARTAN 160 MG PO TABS: 160.0000 mg | ORAL_TABLET | Freq: Every day | ORAL | 3 refills | Status: DC

## 2024-02-12 MED ORDER — ATORVASTATIN CALCIUM 10 MG PO TABS: 10.0000 mg | ORAL_TABLET | Freq: Every day | ORAL | 1 refills | Status: DC

## 2024-02-12 MED ORDER — CYCLOBENZAPRINE HCL 5 MG PO TABS: 5.0000 mg | ORAL_TABLET | Freq: Three times a day (TID) | ORAL | 1 refills | Status: AC | PRN

## 2024-02-12 NOTE — Assessment & Plan Note (Signed)
 Hospital chart reviewed, including discharge summary Medications reconciled and reviewed with the patient in detail

## 2024-02-12 NOTE — Assessment & Plan Note (Addendum)
Smokes about 3 cigarettes/day  Asked about quitting: confirms that he currently smokes cigarettes Advise to quit smoking: Educated about QUITTING to reduce the risk of cancer, cardio and cerebrovascular disease. Assess willingness: Unwilling to quit at this time, but is working on cutting back. Assist with counseling and pharmacotherapy: Counseled for 5 minutes and literature provided. Arrange for follow up: follow up in 3 months and continue to offer help.

## 2024-02-12 NOTE — Progress Notes (Signed)
 Established Patient Office Visit  Subjective:  Patient ID: Timothy Harrington, male    DOB: September 27, 1965  Age: 58 y.o. MRN: [id number]  CC:  Chief Complaint  Patient presents with   Hospitalization Follow-up    High blood pressure    HPI Timothy Harrington is a 58 y.o. male  has a past medical history of Hyperlipidemia LDL goal <100, Hypertension, and Kidney stones.  Patient presents for follow-up for his chronic medical conditions  Hypertension.  Currently on valsartan  160 mg daily, stated that he had stopped taking his blood pressure medication for a few days after that he noticed that his blood pressure became uncontrolled.  He had presented to the emergency department afterwards and he was recommended to start taking his medication daily he denies chest pain shortness of breath edema.  Chronic right-sided low back pain.  Patient complains of chronic right-sided low back pain that has become worse in the past 1 month, pain is worse when laying down and when he tries to get up.  Stated that his pain is not bad currently unable to rate his pain.  Uses lidocaine  as needed.  He denies numbness, tingling,  Due for Tdap vaccine shingles vaccine pneumococcal vaccine need for all vaccines discussed patient declined vaccine.  He has received the Cologuard testing kit but has not completed the test, encouraged to consider completing the test   Past Medical History:  Diagnosis Date   Hyperlipidemia LDL goal <100    Hypertension    Kidney stones     Past Surgical History:  Procedure Laterality Date   KIDNEY STONE SURGERY      Family History  Problem Relation Age of Onset   Stomach cancer Mother    Diabetes Mother    Kidney disease Father    Heart disease Sister    Cancer Neg Hx    Hypertension Neg Hx     Social History   Socioeconomic History   Marital status: Legally Separated    Spouse name: Not on file   Number of children: 3   Years of education: Not on file   Highest  education level: Some college, no degree  Occupational History   Not on file  Tobacco Use   Smoking status: Every Day    Current packs/day: 0.10    Types: Cigarettes   Smokeless tobacco: Never   Tobacco comments:    2 cigs per day  Vaping Use   Vaping status: Never Used  Substance and Sexual Activity   Alcohol use: Yes    Comment: Occassionally.   Drug use: Yes    Frequency: 3.0 times per week    Comment: Sometimes   Sexual activity: Yes    Birth control/protection: None  Other Topics Concern   Not on file  Social History Narrative   Right handed. Lives wit his daughter.    Social Drivers of Corporate investment banker Strain: Low Risk  (12/28/2022)   Overall Financial Resource Strain (CARDIA)    Difficulty of Paying Living Expenses: Not hard at all  Food Insecurity: No Food Insecurity (01/30/2024)   Hunger Vital Sign    Worried About Running Out of Food in the Last Year: Never true    Ran Out of Food in the Last Year: Never true  Transportation Needs: No Transportation Needs (01/30/2024)   PRAPARE - Administrator, Civil Service (Medical): No    Lack of Transportation (Non-Medical): No  Physical Activity: Insufficiently Active (12/28/2022)  Exercise Vital Sign    Days of Exercise per Week: 3 days    Minutes of Exercise per Session: 30 min  Stress: No Stress Concern Present (12/28/2022)   [employer] of Occupational Health - Occupational Stress Questionnaire    Feeling of Stress : Only a little  Social Connections: Moderately Isolated (12/28/2022)   Social Connection and Isolation Panel    Frequency of Communication with Friends and Family: More than three times a week    Frequency of Social Gatherings with Friends and Family: Three times a week    Attends Religious Services: 1 to 4 times per year    Active Member of Clubs or Organizations: No    Attends Engineer, structural: Not on file    Marital Status: Separated  Intimate Partner Violence: Not  on file    Outpatient Medications Prior to Visit  Medication Sig Dispense Refill   sildenafil  (VIAGRA ) 100 MG tablet Take 1 tablet (100 mg total) by mouth as needed for erectile dysfunction. 10 tablet 1   atorvastatin  (LIPITOR) 10 MG tablet Take 1 tablet (10 mg total) by mouth daily. 90 tablet 1   valsartan  (DIOVAN ) 160 MG tablet Take 1 tablet (160 mg total) by mouth daily. 90 tablet 3   ibuprofen  (ADVIL ) 200 MG tablet Take 200 mg by mouth every 6 (six) hours as needed. (Patient not taking: Reported on 02/12/2024)     No facility-administered medications prior to visit.    No Known Allergies  ROS Review of Systems  Constitutional:  Negative for appetite change, chills, fatigue and fever.  HENT:  Negative for congestion, postnasal drip, rhinorrhea and sneezing.   Respiratory:  Negative for cough, shortness of breath and wheezing.   Cardiovascular:  Negative for chest pain, palpitations and leg swelling.  Gastrointestinal:  Negative for abdominal pain, constipation, nausea and vomiting.  Genitourinary:  Negative for difficulty urinating, dysuria, flank pain and frequency.  Musculoskeletal:  Positive for arthralgias and back pain. Negative for joint swelling and myalgias.  Skin:  Negative for color change, pallor, rash and wound.  Neurological:  Negative for dizziness, facial asymmetry, weakness, numbness and headaches.  Psychiatric/Behavioral:  Negative for behavioral problems, confusion, self-injury and suicidal ideas.       Objective:    Physical Exam Vitals and nursing note reviewed.  Constitutional:      General: He is not in acute distress.    Appearance: Normal appearance. He is not ill-appearing, toxic-appearing or diaphoretic.  Eyes:     General: No scleral icterus.       Right eye: No discharge.        Left eye: No discharge.     Extraocular Movements: Extraocular movements intact.     Conjunctiva/sclera: Conjunctivae normal.  Cardiovascular:     Rate and Rhythm:  Normal rate and regular rhythm.     Pulses: Normal pulses.     Heart sounds: Normal heart sounds. No murmur heard.    No friction rub. No gallop.  Pulmonary:     Effort: Pulmonary effort is normal. No respiratory distress.     Breath sounds: Normal breath sounds. No stridor. No wheezing, rhonchi or rales.  Chest:     Chest wall: No tenderness.  Abdominal:     General: There is no distension.     Palpations: Abdomen is soft.     Tenderness: There is no abdominal tenderness. There is no right CVA tenderness, left CVA tenderness or guarding.  Musculoskeletal:  General: Tenderness present. No swelling, deformity or signs of injury.     Right lower leg: No edema.     Left lower leg: No edema.     Comments: Tenderness on palpation of right side of low back  Skin:    General: Skin is warm and dry.     Capillary Refill: Capillary refill takes less than 2 seconds.     Coloration: Skin is not jaundiced or pale.     Findings: No bruising, erythema or lesion.  Neurological:     Mental Status: He is alert and oriented to person, place, and time.     Motor: No weakness.     Coordination: Coordination normal.     Gait: Gait normal.  Psychiatric:        Mood and Affect: Mood normal.        Behavior: Behavior normal.        Thought Content: Thought content normal.        Judgment: Judgment normal.     BP 126/77   Pulse 98   Wt 186 lb (84.4 kg)   SpO2 (!) 85%   BMI 27.47 kg/m  Wt Readings from Last 3 Encounters:  02/12/24 186 lb (84.4 kg)  01/28/24 184 lb 15.5 oz (83.9 kg)  01/28/24 185 lb (83.9 kg)    Lab Results  Component Value Date   TSH 1.550 12/28/2022   Lab Results  Component Value Date   WBC 7.7 01/28/2024   HGB 16.0 01/28/2024   HCT 43.9 01/28/2024   MCV 78.7 (L) 01/28/2024   PLT 206 01/28/2024   Lab Results  Component Value Date   NA 138 01/28/2024   K 4.1 01/28/2024   CO2 25 01/28/2024   GLUCOSE 100 (H) 01/28/2024   BUN 12 01/28/2024   CREATININE 1.29  (H) 01/28/2024   BILITOT 1.4 (H) 12/28/2022   ALKPHOS 72 12/28/2022   AST 22 12/28/2022   ALT 20 12/28/2022   PROT 7.9 12/28/2022   ALBUMIN 4.5 12/28/2022   CALCIUM  9.6 01/28/2024   ANIONGAP 9 01/28/2024   EGFR 56 (L) 01/12/2023   Lab Results  Component Value Date   CHOL 267 (H) 12/28/2022   Lab Results  Component Value Date   HDL 56 12/28/2022   Lab Results  Component Value Date   LDLCALC 164 (H) 12/28/2022   Lab Results  Component Value Date   TRIG 253 (H) 12/28/2022   Lab Results  Component Value Date   CHOLHDL 4.8 12/28/2022   Lab Results  Component Value Date   HGBA1C 5.1 04/27/2022   HGBA1C 5.1 04/27/2022   HGBA1C 5.1 (A) 04/27/2022   HGBA1C 5.1 04/27/2022      Assessment & Plan:   Problem List Items Addressed This Visit       Cardiovascular and Mediastinum   High blood pressure - Primary   Continue valsartan  160 mg daily DASH diet and commitment to daily physical activity for a minimum of 30 minutes discussed and encouraged, as a part of hypertension management.      02/12/2024    2:49 PM 01/28/2024    7:29 PM 01/28/2024    7:00 PM 01/28/2024    5:15 PM 01/28/2024    4:10 PM 01/28/2024    4:00 PM 01/28/2024    2:24 PM  BP/Weight  Systolic BP 126 168 187 177  188 192   Diastolic BP 77 100 115 117  106 104   Wt. (Lbs) 186    184.97  BMI 27.47 kg/m2    27.31 kg/m2       Significant value           Relevant Medications   atorvastatin  (LIPITOR) 10 MG tablet   valsartan  (DIOVAN ) 160 MG tablet     Genitourinary   CKD (chronic kidney disease) stage 2, GFR 60-89 ml/min   Lab Results  Component Value Date   NA 138 01/28/2024   K 4.1 01/28/2024   CO2 25 01/28/2024   GLUCOSE 100 (H) 01/28/2024   BUN 12 01/28/2024   CREATININE 1.29 (H) 01/28/2024   CALCIUM  9.6 01/28/2024   EGFR 56 (L) 01/12/2023   GFRNONAA >60 01/28/2024  Avoid NSAIDs and other nephrotoxic agents Encouraged to drink at least 64 ounces of water daily to maintain hydration         Other   Cigarette smoker   Smokes about 3 cigarettes /day  Asked about quitting: confirms that he currently smokes cigarettes Advise to quit smoking: Educated about QUITTING to reduce the risk of cancer, cardio and cerebrovascular disease. Assess willingness: Unwilling to quit at this time, but is working on cutting back. Assist with counseling and pharmacotherapy: Counseled for 5 minutes and literature provided. Arrange for follow up: follow up in 3 months and continue to offer help.       Hyperlipidemia   Lab Results  Component Value Date   CHOL 267 (H) 12/28/2022   HDL 56 12/28/2022   LDLCALC 164 (H) 12/28/2022   TRIG 253 (H) 12/28/2022   CHOLHDL 4.8 12/28/2022  On atorvastatin  10 mg daily Will check lipid panel at next visit Avoid fatty fried foods smoking cessation encouraged  The 10-year ASCVD risk score (Arnett DK, et al., 2019) is: 22.5%   Values used to calculate the score:     Age: 27 years     Clincally relevant sex: Male     Is Non-Hispanic African American: Yes     Diabetic: No     Tobacco smoker: Yes     Systolic Blood Pressure: 126 mmHg     Is BP treated: Yes     HDL Cholesterol: 56 mg/dL     Total Cholesterol: 267 mg/dL       Relevant Medications   atorvastatin  (LIPITOR) 10 MG tablet   valsartan  (DIOVAN ) 160 MG tablet   Chronic left-sided low back pain without sciatica   Take Tylenol  650 mg every 6 hours as needed - DG Lumbar Spine 2-3 Views; Future - lidocaine  (LIDODERM ) 5 %; Place 1 patch onto the skin daily. Remove & Discard patch within 12 hours or as directed by MD  Dispense: 30 patch; Refill: 0 - cyclobenzaprine  (FLEXERIL ) 5 MG tablet; Take 1 tablet (5 mg total) by mouth 3 (three) times daily as needed for muscle spasms.  Dispense: 30 tablet; Refill:  Side effect of drowsiness with Flexeril  discussed advised to take medication at night if drowsiness occurs      Relevant Medications   lidocaine  (LIDODERM ) 5 %   cyclobenzaprine  (FLEXERIL ) 5  MG tablet   Other Relevant Orders   DG Lumbar Spine 2-3 Views   Encounter for examination following treatment at hospital   Hospital chart reviewed, including discharge summary Medications reconciled and reviewed with the patient in detail        Meds ordered this encounter  Medications   atorvastatin  (LIPITOR) 10 MG tablet    Sig: Take 1 tablet (10 mg total) by mouth daily.    Dispense:  90 tablet  Refill:  1   valsartan  (DIOVAN ) 160 MG tablet    Sig: Take 1 tablet (160 mg total) by mouth daily.    Dispense:  90 tablet    Refill:  3   lidocaine  (LIDODERM ) 5 %    Sig: Place 1 patch onto the skin daily. Remove & Discard patch within 12 hours or as directed by MD    Dispense:  30 patch    Refill:  0   cyclobenzaprine  (FLEXERIL ) 5 MG tablet    Sig: Take 1 tablet (5 mg total) by mouth 3 (three) times daily as needed for muscle spasms.    Dispense:  30 tablet    Refill:  1    Follow-up: Return in about 6 weeks (around 03/25/2024) for CPE.    Hesper Venturella R Ory Elting, FNP

## 2024-02-12 NOTE — Assessment & Plan Note (Signed)
 Take Tylenol  650 mg every 6 hours as needed - DG Lumbar Spine 2-3 Views; Future - lidocaine  (LIDODERM ) 5 %; Place 1 patch onto the skin daily. Remove & Discard patch within 12 hours or as directed by MD  Dispense: 30 patch; Refill: 0 - cyclobenzaprine  (FLEXERIL ) 5 MG tablet; Take 1 tablet (5 mg total) by mouth 3 (three) times daily as needed for muscle spasms.  Dispense: 30 tablet; Refill:  Side effect of drowsiness with Flexeril  discussed advised to take medication at night if drowsiness occurs

## 2024-02-12 NOTE — Assessment & Plan Note (Signed)
 Lab Results  Component Value Date   CHOL 267 (H) 12/28/2022   HDL 56 12/28/2022   LDLCALC 164 (H) 12/28/2022   TRIG 253 (H) 12/28/2022   CHOLHDL 4.8 12/28/2022  On atorvastatin  10 mg daily Will check lipid panel at next visit Avoid fatty fried foods smoking cessation encouraged  The 10-year ASCVD risk score (Arnett DK, et al., 2019) is: 22.5%   Values used to calculate the score:     Age: 58 years     Clincally relevant sex: Male     Is Non-Hispanic African American: Yes     Diabetic: No     Tobacco smoker: Yes     Systolic Blood Pressure: 126 mmHg     Is BP treated: Yes     HDL Cholesterol: 56 mg/dL     Total Cholesterol: 267 mg/dL

## 2024-02-12 NOTE — Assessment & Plan Note (Signed)
 Lab Results  Component Value Date   NA 138 01/28/2024   K 4.1 01/28/2024   CO2 25 01/28/2024   GLUCOSE 100 (H) 01/28/2024   BUN 12 01/28/2024   CREATININE 1.29 (H) 01/28/2024   CALCIUM  9.6 01/28/2024   EGFR 56 (L) 01/12/2023   GFRNONAA >60 01/28/2024  Avoid NSAIDs and other nephrotoxic agents Encouraged to drink at least 64 ounces of water daily to maintain hydration

## 2024-02-12 NOTE — Assessment & Plan Note (Addendum)
 Continue valsartan  160 mg daily DASH diet and commitment to daily physical activity for a minimum of 30 minutes discussed and encouraged, as a part of hypertension management.      02/12/2024    2:49 PM 01/28/2024    7:29 PM 01/28/2024    7:00 PM 01/28/2024    5:15 PM 01/28/2024    4:10 PM 01/28/2024    4:00 PM 01/28/2024    2:24 PM  BP/Weight  Systolic BP 126 168 187 177  188 192   Diastolic BP 77 100 115 117  106 104   Wt. (Lbs) 186    184.97    BMI 27.47 kg/m2    27.31 kg/m2       Significant value

## 2024-02-12 NOTE — Patient Instructions (Signed)
 Please take Tylenol  650 mg every 6 hours as needed for pain Continue lidocaine  5% patch every 24 hours, also encouraged use of heating pad, stretching exercises   Flexeril  5 mg 3 times daily as needed for muscle spasm ordered, this medication can make you feel drowsy.  If drowsiness occurs and take medication at nighttime.    1. Primary hypertension (Primary)  - valsartan  (DIOVAN ) 160 MG tablet; Take 1 tablet (160 mg total) by mouth daily.  Dispense: 90 tablet; Refill: 3  2. Cigarette smoker   3. CKD (chronic kidney disease) stage 2, GFR 60-89 ml/min   4. Dyslipidemia  - atorvastatin  (LIPITOR) 10 MG tablet; Take 1 tablet (10 mg total) by mouth daily.  Dispense: 90 tablet; Refill: 1  5. Chronic left-sided low back pain without sciatica And - DG Lumbar Spine 2-3 Views; Future - lidocaine  (LIDODERM ) 5 %; Place 1 patch onto the skin daily. Remove & Discard patch within 12 hours or as directed by MD  Dispense: 30 patch; Refill: 0 - cyclobenzaprine  (FLEXERIL ) 5 MG tablet; Take 1 tablet (5 mg total) by mouth 3 (three) times daily as needed for muscle spasms.  Dispense: 30 tablet; Refill:     It is important that you exercise regularly at least 30 minutes 5 times a week as tolerated  Think about what you will eat, plan ahead. Choose  clean, green, fresh or frozen over canned, processed or packaged foods which are more sugary, salty and fatty. 70 to 75% of food eaten should be vegetables and fruit. Three meals at set times with snacks allowed between meals, but they must be fruit or vegetables. Aim to eat over a 12 hour period , example 7 am to 7 pm, and STOP after  your last meal of the day. Drink water,generally about 64 ounces per day, no other drink is as healthy. Fruit juice is best enjoyed in a healthy way, by EATING the fruit.  Thanks for choosing [facility] we consider it a privelige to serve you.

## 2024-03-11 ENCOUNTER — Other Ambulatory Visit: Payer: Self-pay | Admitting: Nurse Practitioner

## 2024-03-11 DIAGNOSIS — M545 Low back pain, unspecified: Secondary | ICD-10-CM

## 2024-03-11 NOTE — Telephone Encounter (Signed)
 Please advise [facility]

## 2024-03-15 ENCOUNTER — Other Ambulatory Visit: Payer: Self-pay | Admitting: Nurse Practitioner

## 2024-03-15 DIAGNOSIS — N529 Male erectile dysfunction, unspecified: Secondary | ICD-10-CM

## 2024-04-01 ENCOUNTER — Encounter: Payer: Self-pay | Admitting: Nurse Practitioner

## 2024-04-01 ENCOUNTER — Ambulatory Visit (INDEPENDENT_AMBULATORY_CARE_PROVIDER_SITE_OTHER): Payer: Self-pay | Admitting: Nurse Practitioner

## 2024-04-01 VITALS — BP 162/96 | HR 70 | Temp 97.3°F | Ht 69.5 in | Wt 187.0 lb

## 2024-04-01 DIAGNOSIS — N182 Chronic kidney disease, stage 2 (mild): Secondary | ICD-10-CM

## 2024-04-01 DIAGNOSIS — Z1211 Encounter for screening for malignant neoplasm of colon: Secondary | ICD-10-CM | POA: Diagnosis not present

## 2024-04-01 DIAGNOSIS — F1290 Cannabis use, unspecified, uncomplicated: Secondary | ICD-10-CM

## 2024-04-01 DIAGNOSIS — M25511 Pain in right shoulder: Secondary | ICD-10-CM

## 2024-04-01 DIAGNOSIS — Z125 Encounter for screening for malignant neoplasm of prostate: Secondary | ICD-10-CM | POA: Diagnosis not present

## 2024-04-01 DIAGNOSIS — E782 Mixed hyperlipidemia: Secondary | ICD-10-CM | POA: Diagnosis not present

## 2024-04-01 DIAGNOSIS — M545 Low back pain, unspecified: Secondary | ICD-10-CM

## 2024-04-01 DIAGNOSIS — Z0000 Encounter for general adult medical examination without abnormal findings: Secondary | ICD-10-CM

## 2024-04-01 DIAGNOSIS — F17210 Nicotine dependence, cigarettes, uncomplicated: Secondary | ICD-10-CM

## 2024-04-01 DIAGNOSIS — G8929 Other chronic pain: Secondary | ICD-10-CM | POA: Insufficient documentation

## 2024-04-01 DIAGNOSIS — H6122 Impacted cerumen, left ear: Secondary | ICD-10-CM | POA: Insufficient documentation

## 2024-04-01 DIAGNOSIS — I10 Essential (primary) hypertension: Secondary | ICD-10-CM

## 2024-04-01 NOTE — Assessment & Plan Note (Signed)
   General health maintenance discussed including exercise, diet, hydration, and safety measures. - Encourage 30 minutes of exercise daily. - Recommend low salt, low fat diet. - Advise 7-8 hours of sleep nightly. - Encourage hydration with at least 60 ounces of water daily. - Ensure use of seatbelts. - Consider pneumococcal and flu vaccines. Colon cancer screening ordered

## 2024-04-01 NOTE — Assessment & Plan Note (Signed)
 Chronic right shoulder pain Chronic right shoulder pain, rated at 5/10, persisting for approximately 20 years. - Order x-ray of the right shoulder. - Refer to orthopedics for further evaluation.

## 2024-04-01 NOTE — Progress Notes (Signed)
 Complete physical exam  Patient: Timothy Harrington   DOB: 1966-07-09   58 y.o. Male  MRN: [id number]  Subjective:    Chief Complaint  Patient presents with   Annual Exam     Discussed the use of AI scribe software for clinical note transcription with the patient, who gave verbal consent to proceed.  History of Present Illness Timothy Harrington is a 58 year old male  has a past medical history of CKD (chronic kidney disease) stage 2, GFR 60-89 ml/min (04/27/2022), Hyperlipidemia LDL goal <100, Hypertension, and Kidney stones. who presents for a CPE  He has persistent back pain, rated as 5 out of 10 in intensity, primarily located in the lower back and more pronounced on the right side, though it can alternate sides. He experiences tingling in his right arm, which he attributes to a pinched nerve. He occasionally uses lidocaine  patches and muscle relaxers for severe pain but has not tried a heating pad. He reports that an MRI was performed in 2023 and he was told there were some changes in his cervical spine. He has not completed a previously ordered x-ray of his low back.  He experiences chronic right shoulder pain, present for approximately 20 years, currently rated as 5 out of 10. He does not regularly take medication for this pain but uses lidocaine  patches occasionally.  He is on valsartan  160 mg daily for hypertension and 10 mg of atorvastatin  daily for hyperlipidemia. He prefers Tylenol  over ibuprofen  for pain management. Recently, he had a week of poor dietary habits, including increased consumption of fried foods due to social events. He typically avoids red meat and tries to consume a lot of greens. He engages in daily walking exercises for about 30 minutes, sometimes more on weekends.  He occasionally uses marijuana and smokes two to three cigarettes a day. He drinks alcohol occasionally. He has a family history of colon cancer in a first cousin.  No fever, chills, chest pain,  shortness of breath, nausea, vomiting, abdominal pain, or swelling in the legs.    Assessment & Plan   Follow-Up Follow-up plans for blood pressure monitoring and colon cancer screening. Opted for colonoscopy over Cologuard due to family history of colon cancer in a first cousin. - Schedule colonoscopy for colon cancer screening. - Follow up in 2 weeks for nurse visit to check blood pressure.    Most recent fall risk assessment:    03/10/2023    1:18 PM  Fall Risk   Falls in the past year? 0  Number falls in past yr: 0  Injury with Fall? 0  Risk for fall due to : No Fall Risks     Most recent depression screenings:    04/01/2024    8:36 AM 02/12/2024    2:52 PM  PHQ 2/9 Scores  PHQ - 2 Score 0 0  PHQ- 9 Score 0 3        Patient Care Team: Shakeema Lippman R, FNP as PCP - General (Nurse Practitioner)   Outpatient Medications Prior to Visit  Medication Sig   atorvastatin  (LIPITOR) 10 MG tablet Take 1 tablet (10 mg total) by mouth daily.   cyclobenzaprine  (FLEXERIL ) 5 MG tablet Take 1 tablet (5 mg total) by mouth 3 (three) times daily as needed for muscle spasms.   ibuprofen  (ADVIL ) 200 MG tablet Take 200 mg by mouth every 6 (six) hours as needed.   lidocaine  (LIDODERM ) 5 % PLACE 1 PATCH ONTO THE SKIN DAILY.  REMOVE & DISCARD PATCH WITHIN 12 HOURS OR AS DIRECTED BY MD   sildenafil  (VIAGRA ) 100 MG tablet TAKE 1 TABLET BY MOUTH EVERY DAY AS NEEDED FOR ERECTILE DYSFUNCTION   valsartan  (DIOVAN ) 160 MG tablet Take 1 tablet (160 mg total) by mouth daily.   No facility-administered medications prior to visit.    Review of Systems  Constitutional:  Negative for appetite change, chills, fatigue and fever.  HENT:  Negative for congestion, postnasal drip, rhinorrhea and sneezing.   Eyes:  Negative for pain, discharge and itching.  Respiratory:  Negative for cough, shortness of breath and wheezing.   Cardiovascular:  Negative for chest pain, palpitations and leg swelling.   Gastrointestinal:  Negative for abdominal pain, constipation, nausea and vomiting.  Genitourinary:  Negative for difficulty urinating, dysuria, flank pain and frequency.  Musculoskeletal:  Positive for arthralgias and back pain. Negative for joint swelling and myalgias.  Skin:  Negative for color change, pallor, rash and wound.  Neurological:  Negative for dizziness, facial asymmetry, weakness, numbness and headaches.  Psychiatric/Behavioral:  Negative for behavioral problems, confusion, self-injury and suicidal ideas.        Objective:     BP (!) 162/96   Pulse 70   Temp (!) 97.3 F (36.3 C)   Ht 5' 9.5 (1.765 m)   Wt 187 lb (84.8 kg)   SpO2 100%   BMI 27.22 kg/m    Physical Exam Vitals and nursing note reviewed. Exam conducted with a chaperone present.  Constitutional:      General: He is not in acute distress.    Appearance: Normal appearance. He is not ill-appearing, toxic-appearing or diaphoretic.  HENT:     Right Ear: Tympanic membrane, ear canal and external ear normal. There is no impacted cerumen.     Left Ear: Tympanic membrane, ear canal and external ear normal. There is impacted cerumen.     Nose: Nose normal. No congestion or rhinorrhea.     Mouth/Throat:     Mouth: Mucous membranes are moist.     Pharynx: Oropharynx is clear. No oropharyngeal exudate or posterior oropharyngeal erythema.  Eyes:     General: No scleral icterus.       Right eye: No discharge.        Left eye: No discharge.     Extraocular Movements: Extraocular movements intact.     Conjunctiva/sclera: Conjunctivae normal.  Neck:     Vascular: No carotid bruit.  Cardiovascular:     Rate and Rhythm: Normal rate and regular rhythm.     Pulses: Normal pulses.     Heart sounds: Normal heart sounds. No murmur heard.    No friction rub. No gallop.  Pulmonary:     Effort: Pulmonary effort is normal. No respiratory distress.     Breath sounds: Normal breath sounds. No stridor. No wheezing,  rhonchi or rales.  Chest:     Chest wall: No tenderness.  Abdominal:     General: Bowel sounds are normal. There is no distension.     Palpations: Abdomen is soft. There is no mass.     Tenderness: There is no abdominal tenderness. There is no right CVA tenderness, left CVA tenderness, guarding or rebound.     Hernia: No hernia is present.  Musculoskeletal:        General: Tenderness present. No swelling, deformity or signs of injury.     Cervical back: Normal range of motion and neck supple. No rigidity or tenderness.     Right  lower leg: No edema.     Left lower leg: No edema.     Comments: Tenderness on palpation of low back Tenderness on range of motion of right shoulder Skin warm and dry no redness or swelling noted  Lymphadenopathy:     Cervical: No cervical adenopathy.  Skin:    General: Skin is warm and dry.     Capillary Refill: Capillary refill takes less than 2 seconds.     Coloration: Skin is not jaundiced or pale.     Findings: No bruising, erythema, lesion or rash.  Neurological:     Mental Status: He is alert and oriented to person, place, and time.     Cranial Nerves: No cranial nerve deficit.     Sensory: No sensory deficit.     Motor: No weakness.     Coordination: Coordination normal.     Gait: Gait normal.     Deep Tendon Reflexes: Reflexes normal.  Psychiatric:        Mood and Affect: Mood normal.        Behavior: Behavior normal.        Thought Content: Thought content normal.        Judgment: Judgment normal.     No results found for any visits on 04/01/24.     Assessment & Plan:    Routine Health Maintenance and Physical Exam   There is no immunization history on file for this patient.  Health Maintenance  Topic Date Due   Pneumococcal Vaccine: 50+ Years (1 of 2 - PCV) Never done   Fecal DNA (Cologuard)  Never done   Influenza Vaccine  Never done   COVID-19 Vaccine (1 - 2024-25 season) Never done   Zoster Vaccines- Shingrix (1 of 2)  05/14/2024 (Originally 12/15/2013)   DTaP/Tdap/Td (1 - Tdap) 02/11/2025 (Originally 12/16/1982)   Hepatitis C Screening  Completed   HIV Screening  Completed   Hepatitis B Vaccines 19-59 Average Risk  Aged Out   HPV VACCINES  Aged Out   Meningococcal B Vaccine  Aged Out    Discussed health benefits of physical activity, and encouraged him to engage in regular exercise appropriate for his age and condition.  Problem List Items Addressed This Visit       Cardiovascular and Mediastinum   High blood pressure   Blood pressure reading at his last visit was well-controlled Reports poor dietary habits over the past week. - Schedule nurse visit in 2 weeks for blood pressure check. - Instruct to monitor blood pressure at home at least 3 times weekly and report if consistently >140/90 mmHg. - Continue Valsartan  160 mg daily. Discussed DASH diet and dietary sodium restrictions Continue to increase dietary efforts and exercise.       04/01/2024    8:40 AM 04/01/2024    8:34 AM 02/12/2024    2:49 PM 01/28/2024    7:29 PM 01/28/2024    7:00 PM 01/28/2024    5:15 PM 01/28/2024    4:10 PM  BP/Weight  Systolic BP 162 169 126 168 187 177   Diastolic BP 96 101 77 100 115 882   Wt. (Lbs)  187 186    184.97  BMI  27.22 kg/m2 27.47 kg/m2    27.31 kg/m2           Relevant Orders   Recheck vitals     Nervous and Auditory   Impacted cerumen, left ear   Debrox advised        Genitourinary  CKD (chronic kidney disease) stage 2, GFR 60-89 ml/min   Lab Results  Component Value Date   NA 138 01/28/2024   K 4.1 01/28/2024   CO2 25 01/28/2024   GLUCOSE 100 (H) 01/28/2024   BUN 12 01/28/2024   CREATININE 1.29 (H) 01/28/2024   CALCIUM  9.6 01/28/2024   EGFR 56 (L) 01/12/2023   GFRNONAA >60 01/28/2024  Avoid NSAIDs and other nephrotoxic agents On valsartan  160 mg daily for hypertension        Other   Cigarette smoker     Tobacco use disorder with current consumption of 2-3 cigarettes per day.  Discussed risks including lung cancer and COPD. - Encourage smoking cessation.        Marijuana smoker    Occasional cannabis use reported. General discouragement of use discussed. - Advise against cannabis use.      Annual physical exam - Primary     General health maintenance discussed including exercise, diet, hydration, and safety measures. - Encourage 30 minutes of exercise daily. - Recommend low salt, low fat diet. - Advise 7-8 hours of sleep nightly. - Encourage hydration with at least 60 ounces of water daily. - Ensure use of seatbelts. - Consider pneumococcal and flu vaccines. Colon cancer screening ordered      Hyperlipidemia   Currently on atorvastatin  10 mg daily Rechecking lipid panel Lab Results  Component Value Date   CHOL 267 (H) 12/28/2022   HDL 56 12/28/2022   LDLCALC 164 (H) 12/28/2022   TRIG 253 (H) 12/28/2022   CHOLHDL 4.8 12/28/2022         Relevant Orders   Lipid panel   Chronic left-sided low back pain without sciatica    rated at 5/10, primarily on the right side but alternating. Foraminal narrowing in the cervical spine on MRI from 2023. - Refer to orthopedics for further evaluation. - Order x-ray of the low back  - Continue use of lidocaine  patch as needed. - Recommend Tylenol  650 mg every 6 hours as needed. - Encourage stretching exercises and application of heat and ice.       Relevant Orders   DG Lumbar Spine Complete   Ambulatory referral to Orthopedic Surgery   Chronic right shoulder pain   Chronic right shoulder pain Chronic right shoulder pain, rated at 5/10, persisting for approximately 20 years. - Order x-ray of the right shoulder. - Refer to orthopedics for further evaluation.      Relevant Orders   DG Shoulder Right   Ambulatory referral to Orthopedic Surgery   Other Visit Diagnoses       Screening for prostate cancer       Relevant Orders   PSA     Screening for colon cancer       Relevant Orders    Ambulatory referral to Gastroenterology      Return in about 4 months (around 08/01/2024) for HTN.     Breckin Zafar R Anasia Agro, FNP

## 2024-04-01 NOTE — Assessment & Plan Note (Signed)
 rated at 5/10, primarily on the right side but alternating. Foraminal narrowing in the cervical spine on MRI from 2023. - Refer to orthopedics for further evaluation. - Order x-ray of the low back  - Continue use of lidocaine  patch as needed. - Recommend Tylenol  650 mg every 6 hours as needed. - Encourage stretching exercises and application of heat and ice.

## 2024-04-01 NOTE — Assessment & Plan Note (Signed)
  Occasional cannabis use reported. General discouragement of use discussed. - Advise against cannabis use.

## 2024-04-01 NOTE — Assessment & Plan Note (Signed)
Debrox advised

## 2024-04-01 NOTE — Assessment & Plan Note (Signed)
 Lab Results  Component Value Date   NA 138 01/28/2024   K 4.1 01/28/2024   CO2 25 01/28/2024   GLUCOSE 100 (H) 01/28/2024   BUN 12 01/28/2024   CREATININE 1.29 (H) 01/28/2024   CALCIUM  9.6 01/28/2024   EGFR 56 (L) 01/12/2023   GFRNONAA >60 01/28/2024  Avoid NSAIDs and other nephrotoxic agents On valsartan  160 mg daily for hypertension

## 2024-04-01 NOTE — Assessment & Plan Note (Signed)
   Tobacco use disorder with current consumption of 2-3 cigarettes per day. Discussed risks including lung cancer and COPD. - Encourage smoking cessation.

## 2024-04-01 NOTE — Assessment & Plan Note (Signed)
 Blood pressure reading at his last visit was well-controlled Reports poor dietary habits over the past week. - Schedule nurse visit in 2 weeks for blood pressure check. - Instruct to monitor blood pressure at home at least 3 times weekly and report if consistently >140/90 mmHg. - Continue Valsartan  160 mg daily. Discussed DASH diet and dietary sodium restrictions Continue to increase dietary efforts and exercise.       04/01/2024    8:40 AM 04/01/2024    8:34 AM 02/12/2024    2:49 PM 01/28/2024    7:29 PM 01/28/2024    7:00 PM 01/28/2024    5:15 PM 01/28/2024    4:10 PM  BP/Weight  Systolic BP 162 169 126 168 187 177   Diastolic BP 96 101 77 100 115 882   Wt. (Lbs)  187 186    184.97  BMI  27.22 kg/m2 27.47 kg/m2    27.31 kg/m2

## 2024-04-01 NOTE — Patient Instructions (Signed)
 Around 3 times per week, check your blood pressure 2 times per day. once in the morning and once in the evening. The readings should be at least one minute apart. Write down these values and bring them to your next nurse visit/appointment.  When you check your BP, make sure you have been doing something calm/relaxing 5 minutes prior to checking. Both feet should be flat on the floor and you should be sitting. Use your left arm and make sure it is in a relaxed position (on a table), and that the cuff is at the approximate level/height of your heart.  Please call the office if your blood pressure readings remains consistently greater than 140/90  Please get your x-ray done as discussed  1. Mixed hyperlipidemia (Primary)  - Lipid panel  2. Screening for prostate cancer  - PSA  3. Screening for colon cancer  - Ambulatory referral to Gastroenterology  4. Chronic left-sided low back pain without sciatica  - DG Lumbar Spine Complete; Future  5. Chronic right shoulder pain  - DG Shoulder Right; Future  6. Primary hypertension  - Recheck vitals     It is important that you exercise regularly at least 30 minutes 5 times a week as tolerated  Think about what you will eat, plan ahead. Choose  clean, green, fresh or frozen over canned, processed or packaged foods which are more sugary, salty and fatty. 70 to 75% of food eaten should be vegetables and fruit. Three meals at set times with snacks allowed between meals, but they must be fruit or vegetables. Aim to eat over a 12 hour period , example 7 am to 7 pm, and STOP after  your last meal of the day. Drink water,generally about 64 ounces per day, no other drink is as healthy. Fruit juice is best enjoyed in a healthy way, by EATING the fruit.  Thanks for choosing [facility] we consider it a privelige to serve you.

## 2024-04-01 NOTE — Assessment & Plan Note (Signed)
 Currently on atorvastatin  10 mg daily Rechecking lipid panel Lab Results  Component Value Date   CHOL 267 (H) 12/28/2022   HDL 56 12/28/2022   LDLCALC 164 (H) 12/28/2022   TRIG 253 (H) 12/28/2022   CHOLHDL 4.8 12/28/2022

## 2024-04-02 ENCOUNTER — Ambulatory Visit: Payer: Self-pay | Admitting: Nurse Practitioner

## 2024-04-02 LAB — LIPID PANEL
Chol/HDL Ratio: 3.6 ratio (ref 0.0–5.0)
Cholesterol, Total: 205 mg/dL — ABNORMAL HIGH (ref 100–199)
HDL: 57 mg/dL (ref >39)
LDL Chol Calc (NIH): 115 mg/dL — ABNORMAL HIGH (ref 0–99)
Triglycerides: 193 mg/dL — ABNORMAL HIGH (ref 0–149)
VLDL Cholesterol Cal: 33 mg/dL (ref 5–40)

## 2024-04-02 LAB — PSA: Prostate Specific Ag, Serum: 1 ng/mL (ref 0.0–4.0)

## 2024-04-02 MED ORDER — ATORVASTATIN CALCIUM 20 MG PO TABS: 20.0000 mg | ORAL_TABLET | Freq: Every day | ORAL | 1 refills | Status: AC

## 2024-04-02 NOTE — Addendum Note (Signed)
 Addended by: JUANICE THOMES SAUNDERS on: 04/02/2024 12:49 PM   Modules accepted: Orders

## 2024-04-15 ENCOUNTER — Ambulatory Visit (INDEPENDENT_AMBULATORY_CARE_PROVIDER_SITE_OTHER): Payer: Self-pay

## 2024-04-15 ENCOUNTER — Other Ambulatory Visit: Payer: Self-pay | Admitting: Nurse Practitioner

## 2024-04-15 VITALS — BP 145/92 | HR 69 | Temp 97.0°F | Wt 190.0 lb

## 2024-04-15 DIAGNOSIS — I10 Essential (primary) hypertension: Secondary | ICD-10-CM | POA: Diagnosis not present

## 2024-04-15 DIAGNOSIS — M545 Low back pain, unspecified: Secondary | ICD-10-CM

## 2024-04-15 MED ORDER — AMLODIPINE BESYLATE 2.5 MG PO TABS: 2.5000 mg | ORAL_TABLET | Freq: Every day | ORAL | 1 refills | Status: DC

## 2024-04-15 NOTE — Telephone Encounter (Signed)
 Please advise [facility]

## 2024-04-15 NOTE — Progress Notes (Signed)
   Blood Pressure Recheck Visit  Name: AXZEL ROCKHILL MRN: [id number] Date of Birth: 1966/03/02  OMIR COOPRIDER presents today for Blood Pressure recheck with clinical support staff.  Order for BP recheck by Folashade Paseda, ordered on 04/01/24.   BP Readings from Last 3 Encounters:  04/01/24 (!) 162/96  02/12/24 126/77  01/28/24 (!) 168/100    Current Outpatient Medications  Medication Sig Dispense Refill   atorvastatin  (LIPITOR) 20 MG tablet Take 1 tablet (20 mg total) by mouth daily. 90 tablet 1   cyclobenzaprine  (FLEXERIL ) 5 MG tablet Take 1 tablet (5 mg total) by mouth 3 (three) times daily as needed for muscle spasms. 30 tablet 1   ibuprofen  (ADVIL ) 200 MG tablet Take 200 mg by mouth every 6 (six) hours as needed.     lidocaine  (LIDODERM ) 5 % PLACE 1 PATCH ONTO THE SKIN DAILY. REMOVE & DISCARD PATCH WITHIN 12 HOURS OR AS DIRECTED BY MD 30 patch 0   sildenafil  (VIAGRA ) 100 MG tablet TAKE 1 TABLET BY MOUTH EVERY DAY AS NEEDED FOR ERECTILE DYSFUNCTION 6 tablet 3   valsartan  (DIOVAN ) 160 MG tablet Take 1 tablet (160 mg total) by mouth daily. 90 tablet 3   No current facility-administered medications for this visit.    Hypertensive Medication Review: Patient states that they are taking all their hypertensive medications as prescribed and their last dose of hypertensive medications was this morning   Documentation of any medication adherence discrepancies: none  Provider Recommendation:  Spoke to Folashade Paseda, FNP and they stated: She will add amlodipine  2.5 and follow up in four weeks.   Patient has been scheduled to follow up with 08/02/2024   Patient has been given provider's recommendations and does not have any questions or concerns at this time. Patient will contact the office for any future questions or concerns.   Suzen Shove   CMA II

## 2024-05-13 ENCOUNTER — Ambulatory Visit: Payer: Self-pay | Admitting: Nurse Practitioner

## 2024-06-03 ENCOUNTER — Other Ambulatory Visit: Payer: Self-pay | Admitting: Nurse Practitioner

## 2024-06-03 DIAGNOSIS — M545 Low back pain, unspecified: Secondary | ICD-10-CM

## 2024-06-04 NOTE — Telephone Encounter (Signed)
 Please advise [facility]

## 2024-06-30 ENCOUNTER — Other Ambulatory Visit: Payer: Self-pay | Admitting: Nurse Practitioner

## 2024-06-30 DIAGNOSIS — N529 Male erectile dysfunction, unspecified: Secondary | ICD-10-CM

## 2024-07-01 ENCOUNTER — Encounter: Payer: Self-pay | Admitting: Internal Medicine

## 2024-07-01 NOTE — Telephone Encounter (Signed)
 Please advise [facility]

## 2024-07-08 ENCOUNTER — Ambulatory Visit

## 2024-07-08 VITALS — Ht 69.5 in | Wt 185.0 lb

## 2024-07-08 DIAGNOSIS — Z1211 Encounter for screening for malignant neoplasm of colon: Secondary | ICD-10-CM

## 2024-07-08 MED ORDER — NA SULFATE-K SULFATE-MG SULF 17.5-3.13-1.6 GM/177ML PO SOLN: 1.0000 | Freq: Once | ORAL | 0 refills | Status: AC

## 2024-07-08 NOTE — Progress Notes (Signed)

## 2024-07-09 ENCOUNTER — Ambulatory Visit: Admitting: Orthopaedic Surgery

## 2024-07-09 ENCOUNTER — Encounter: Payer: Self-pay | Admitting: Orthopaedic Surgery

## 2024-07-17 ENCOUNTER — Other Ambulatory Visit: Payer: Self-pay | Admitting: Nurse Practitioner

## 2024-07-17 DIAGNOSIS — I10 Essential (primary) hypertension: Secondary | ICD-10-CM

## 2024-07-29 ENCOUNTER — Encounter: Admitting: Internal Medicine

## 2024-07-30 ENCOUNTER — Encounter: Payer: Self-pay | Admitting: Internal Medicine

## 2024-08-02 ENCOUNTER — Ambulatory Visit (INDEPENDENT_AMBULATORY_CARE_PROVIDER_SITE_OTHER): Payer: Self-pay | Admitting: Nurse Practitioner

## 2024-08-02 ENCOUNTER — Encounter: Payer: Self-pay | Admitting: Nurse Practitioner

## 2024-08-02 VITALS — BP 135/83 | HR 74 | Wt 191.0 lb

## 2024-08-02 DIAGNOSIS — E785 Hyperlipidemia, unspecified: Secondary | ICD-10-CM | POA: Diagnosis not present

## 2024-08-02 DIAGNOSIS — F17200 Nicotine dependence, unspecified, uncomplicated: Secondary | ICD-10-CM

## 2024-08-02 DIAGNOSIS — I10 Essential (primary) hypertension: Secondary | ICD-10-CM | POA: Diagnosis not present

## 2024-08-02 DIAGNOSIS — Z0000 Encounter for general adult medical examination without abnormal findings: Secondary | ICD-10-CM

## 2024-08-02 MED ORDER — VALSARTAN 160 MG PO TABS: 160.0000 mg | ORAL_TABLET | Freq: Every day | ORAL | 3 refills | Status: AC

## 2024-08-02 NOTE — Assessment & Plan Note (Signed)
 Hyperlipidemia Managed with atorvastatin  10 mg daily. Emphasized diet and exercise to reduce medication reliance. - Continue atorvastatin  10 mg daily. - Encouraged dietary modifications and regular exercise.

## 2024-08-02 NOTE — Assessment & Plan Note (Signed)
" °  Tobacco use disorder Smokes 1-3 cigarettes per night, more on weekends. Goal to quit by May to reduce health risks. - Encouraged smoking cessation with a goal to quit by May.  "

## 2024-08-02 NOTE — Assessment & Plan Note (Signed)
 Primary hypertension Blood pressure improved to 135/83. Emphasized maintaining BP <130/80 to prevent complications. - Continue amlodipine  2.5 mg daily and valsartan  160 mg daily. - Encouraged moderate to vigorous exercise for 30 minutes, 5 days a week. - Advised on a heart-healthy, low salt, low fat diet. - Ordered CMP to check kidney and liver function.

## 2024-08-02 NOTE — Progress Notes (Signed)
 "  Established Patient Office Visit  Subjective:  Patient ID: Timothy Harrington, male    DOB: 03-31-1966  Age: 59 y.o. MRN: [id number]  CC:  Chief Complaint  Patient presents with   Hypertension    HPI   Discussed the use of AI scribe software for clinical note transcription with the patient, who gave verbal consent to proceed.  History of Present Illness Timothy Harrington is a 59 year old male   has a past medical history of Arthritis, CKD (chronic kidney disease) stage 2, GFR 60-89 ml/min (04/27/2022), Hyperlipidemia LDL goal <100, Hypertension, and Kidney stones. who presents for blood pressure management.  He has been experiencing elevated blood pressure readings at home, particularly in the mornings before taking his medications. He is currently on amlodipine  2.5 mg daily and valsartan  160 mg daily for hypertension. Recent readings have shown improvement, with the latest being 135/83 mmHg, down from previous readings of 138/91 mmHg and 137/83 mmHg.  He is also on atorvastatin  20 mg daily for cholesterol management. He wants to improve his diet and exercise routine, aiming to reduce his medication use by his birthday in May. Currently, his exercise consists of walking, which he plans to do more consistently.  He smokes one to three cigarettes a night, sometimes more on weekends, and has set a goal to quit smoking by May. He has not received a flu shot and is not interested in getting one. He has a colonoscopy scheduled for Monday and needs to pick up the preparation liquid for the procedure.    Assessment & Plan     Past Medical History:  Diagnosis Date   Arthritis    CKD (chronic kidney disease) stage 2, GFR 60-89 ml/min 04/27/2022   Hyperlipidemia LDL goal <100    Hypertension    Kidney stones     Past Surgical History:  Procedure Laterality Date   KIDNEY STONE SURGERY      Family History  Problem Relation Age of Onset   Stomach cancer Mother    Diabetes Mother     Kidney disease Father    Heart disease Sister    Cancer Neg Hx    Hypertension Neg Hx    Colon cancer Neg Hx    Colon polyps Neg Hx    Esophageal cancer Neg Hx    Rectal cancer Neg Hx     Social History   Socioeconomic History   Marital status: Legally Separated    Spouse name: Not on file   Number of children: 3   Years of education: Not on file   Highest education level: Some college, no degree  Occupational History   Not on file  Tobacco Use   Smoking status: Some Days    Current packs/day: 0.10    Types: Cigarettes   Smokeless tobacco: Never   Tobacco comments:    2 cigs per day  Vaping Use   Vaping status: Never Used  Substance and Sexual Activity   Alcohol use: Yes    Comment: Occassionally.   Drug use: Yes    Frequency: 3.0 times per week    Types: Marijuana    Comment: Sometimes   Sexual activity: Yes    Birth control/protection: None  Other Topics Concern   Not on file  Social History Narrative   Right handed. Lives wit his daughter.    Social Drivers of Health   Tobacco Use: High Risk (08/02/2024)   Patient History    Smoking Tobacco Use: Some  Days    Smokeless Tobacco Use: Never    Passive Exposure: Not on file  Financial Resource Strain: Low Risk (12/28/2022)   Overall Financial Resource Strain (CARDIA)    Difficulty of Paying Living Expenses: Not hard at all  Food Insecurity: No Food Insecurity (08/02/2024)   Epic    Worried About Programme Researcher, Broadcasting/film/video in the Last Year: Never true    Ran Out of Food in the Last Year: Never true  Transportation Needs: No Transportation Needs (08/02/2024)   Epic    Lack of Transportation (Medical): No    Lack of Transportation (Non-Medical): No  Physical Activity: Insufficiently Active (12/28/2022)   Exercise Vital Sign    Days of Exercise per Week: 3 days    Minutes of Exercise per Session: 30 min  Stress: No Stress Concern Present (12/28/2022)   [employer] of Occupational [employer] - Occupational Stress  Questionnaire    Feeling of Stress : Only a little  Social Connections: Moderately Isolated (12/28/2022)   Social Connection and Isolation Panel    Frequency of Communication with Friends and Family: More than three times a week    Frequency of Social Gatherings with Friends and Family: Three times a week    Attends Religious Services: 1 to 4 times per year    Active Member of Clubs or Organizations: No    Attends Banker Meetings: Not on file    Marital Status: Separated  Intimate Partner Violence: Not At Risk (08/02/2024)   Epic    Fear of Current or Ex-Partner: No    Emotionally Abused: No    Physically Abused: No    Sexually Abused: No  Depression (PHQ2-9): Low Risk (08/02/2024)   Depression (PHQ2-9)    PHQ-2 Score: 0  Alcohol Screen: Low Risk (12/28/2022)   Alcohol Screen    Last Alcohol Screening Score (AUDIT): 7  Housing: Low Risk (08/02/2024)   Epic    Unable to Pay for Housing in the Last Year: No    Number of Times Moved in the Last Year: 0    Homeless in the Last Year: No  Utilities: Not At Risk (08/02/2024)   Epic    Threatened with loss of utilities: No  Health Literacy: Not on file    Outpatient Medications Prior to Visit  Medication Sig Dispense Refill   amLODipine  (NORVASC ) 2.5 MG tablet TAKE 1 TABLET BY MOUTH EVERY DAY 90 tablet 1   atorvastatin  (LIPITOR) 20 MG tablet Take 1 tablet (20 mg total) by mouth daily. 90 tablet 1   cyclobenzaprine  (FLEXERIL ) 5 MG tablet Take 1 tablet (5 mg total) by mouth 3 (three) times daily as needed for muscle spasms. 30 tablet 1   ibuprofen  (ADVIL ) 200 MG tablet Take 200 mg by mouth every 6 (six) hours as needed.     lidocaine  (LIDODERM ) 5 % PLACE 1 PATCH ONTO THE SKIN DAILY. REMOVE & DISCARD PATCH WITHIN 12 HOURS OR AS DIRECTED BY MD 30 patch 0   sildenafil  (VIAGRA ) 100 MG tablet TAKE 1 TABLET BY MOUTH EVERY DAY AS NEEDED FOR ERECTILE DYSFUNCTION 10 tablet 1   valsartan  (DIOVAN ) 160 MG tablet Take 1 tablet (160 mg total) by  mouth daily. 90 tablet 3   No facility-administered medications prior to visit.    Allergies[1]  ROS Review of Systems  Constitutional:  Negative for appetite change, chills, fatigue and fever.  HENT:  Negative for congestion, postnasal drip, rhinorrhea and sneezing.   Respiratory:  Negative for cough,  shortness of breath and wheezing.   Cardiovascular:  Negative for chest pain, palpitations and leg swelling.  Gastrointestinal:  Negative for abdominal pain, constipation, nausea and vomiting.  Genitourinary:  Negative for difficulty urinating, dysuria, flank pain and frequency.  Musculoskeletal:  Negative for arthralgias, back pain, joint swelling and myalgias.  Skin:  Negative for color change, pallor, rash and wound.  Neurological:  Negative for dizziness, facial asymmetry, weakness, numbness and headaches.  Psychiatric/Behavioral:  Negative for behavioral problems, confusion, self-injury and suicidal ideas.       Objective:    Physical Exam Vitals and nursing note reviewed.  Constitutional:      General: He is not in acute distress.    Appearance: Normal appearance. He is not ill-appearing, toxic-appearing or diaphoretic.  Eyes:     General: No scleral icterus.       Right eye: No discharge.        Left eye: No discharge.     Extraocular Movements: Extraocular movements intact.     Conjunctiva/sclera: Conjunctivae normal.  Cardiovascular:     Rate and Rhythm: Normal rate and regular rhythm.     Pulses: Normal pulses.     Heart sounds: Normal heart sounds. No murmur heard.    No friction rub. No gallop.  Pulmonary:     Effort: Pulmonary effort is normal. No respiratory distress.     Breath sounds: Normal breath sounds. No stridor. No wheezing, rhonchi or rales.  Chest:     Chest wall: No tenderness.  Abdominal:     General: There is no distension.     Palpations: Abdomen is soft.     Tenderness: There is no abdominal tenderness. There is no right CVA tenderness, left  CVA tenderness or guarding.  Musculoskeletal:        General: No swelling, tenderness, deformity or signs of injury.     Right lower leg: No edema.     Left lower leg: No edema.  Skin:    General: Skin is warm and dry.     Capillary Refill: Capillary refill takes less than 2 seconds.     Coloration: Skin is not jaundiced or pale.     Findings: No bruising, erythema or lesion.  Neurological:     Mental Status: He is alert and oriented to person, place, and time.     Motor: No weakness.     Gait: Gait normal.  Psychiatric:        Mood and Affect: Mood normal.        Behavior: Behavior normal.        Thought Content: Thought content normal.        Judgment: Judgment normal.     BP 135/83   Pulse 74   Wt 191 lb (86.6 kg)   SpO2 99%   BMI 27.80 kg/m  Wt Readings from Last 3 Encounters:  08/02/24 191 lb (86.6 kg)  07/08/24 185 lb (83.9 kg)  04/15/24 190 lb (86.2 kg)    Lab Results  Component Value Date   TSH 1.550 12/28/2022   Lab Results  Component Value Date   WBC 7.7 01/28/2024   HGB 16.0 01/28/2024   HCT 43.9 01/28/2024   MCV 78.7 (L) 01/28/2024   PLT 206 01/28/2024   Lab Results  Component Value Date   NA 138 01/28/2024   K 4.1 01/28/2024   CO2 25 01/28/2024   GLUCOSE 100 (H) 01/28/2024   BUN 12 01/28/2024   CREATININE 1.29 (H) 01/28/2024  BILITOT 1.4 (H) 12/28/2022   ALKPHOS 72 12/28/2022   AST 22 12/28/2022   ALT 20 12/28/2022   PROT 7.9 12/28/2022   ALBUMIN 4.5 12/28/2022   CALCIUM  9.6 01/28/2024   ANIONGAP 9 01/28/2024   EGFR 56 (L) 01/12/2023   Lab Results  Component Value Date   CHOL 205 (H) 04/01/2024   Lab Results  Component Value Date   HDL 57 04/01/2024   Lab Results  Component Value Date   LDLCALC 115 (H) 04/01/2024   Lab Results  Component Value Date   TRIG 193 (H) 04/01/2024   Lab Results  Component Value Date   CHOLHDL 3.6 04/01/2024   Lab Results  Component Value Date   HGBA1C 5.1 04/27/2022   HGBA1C 5.1 04/27/2022    HGBA1C 5.1 (A) 04/27/2022   HGBA1C 5.1 04/27/2022      Assessment & Plan:   Problem List Items Addressed This Visit       Cardiovascular and Mediastinum   High blood pressure   Primary hypertension Blood pressure improved to 135/83. Emphasized maintaining BP <130/80 to prevent complications. - Continue amlodipine  2.5 mg daily and valsartan  160 mg daily. - Encouraged moderate to vigorous exercise for 30 minutes, 5 days a week. - Advised on a heart-healthy, low salt, low fat diet. - Ordered CMP to check kidney and liver function.       Relevant Medications   valsartan  (DIOVAN ) 160 MG tablet   Other Relevant Orders   CMP14+EGFR     Other   Tobacco use disorder    Tobacco use disorder Smokes 1-3 cigarettes per night, more on weekends. Goal to quit by May to reduce health risks. - Encouraged smoking cessation with a goal to quit by May.       Health care maintenance   General health maintenance Declined flu and shingles vaccines. Colonoscopy scheduled. - Encouraged consideration of flu and shingles vaccines. - Ensured colonoscopy preparation by contacting gastroenterologist's office.      Hyperlipidemia - Primary   Hyperlipidemia Managed with atorvastatin  10 mg daily. Emphasized diet and exercise to reduce medication reliance. - Continue atorvastatin  10 mg daily. - Encouraged dietary modifications and regular exercise.      Relevant Medications   valsartan  (DIOVAN ) 160 MG tablet    Meds ordered this encounter  Medications   valsartan  (DIOVAN ) 160 MG tablet    Sig: Take 1 tablet (160 mg total) by mouth daily.    Dispense:  90 tablet    Refill:  3    Follow-up: Return in about 4 months (around 11/30/2024) for HTN.    Honesti Seaberg R Jossie Smoot, FNP     [1] No Known Allergies  "

## 2024-08-02 NOTE — Patient Instructions (Signed)

## 2024-08-02 NOTE — Assessment & Plan Note (Signed)
 General health maintenance Declined flu and shingles vaccines. Colonoscopy scheduled. - Encouraged consideration of flu and shingles vaccines. - Ensured colonoscopy preparation by contacting gastroenterologist's office.

## 2024-08-03 ENCOUNTER — Ambulatory Visit: Payer: Self-pay | Admitting: Nurse Practitioner

## 2024-08-03 LAB — CMP14+EGFR
ALT: 24 IU/L (ref 0–44)
AST: 29 IU/L (ref 0–40)
Albumin: 4.7 g/dL (ref 3.8–4.9)
Alkaline Phosphatase: 69 IU/L (ref 47–123)
BUN/Creatinine Ratio: 11 (ref 10–24)
BUN: 16 mg/dL (ref 8–27)
Bilirubin Total: 1.1 mg/dL (ref 0.0–1.2)
CO2: 23 mmol/L (ref 20–29)
Calcium: 9.6 mg/dL (ref 8.6–10.2)
Chloride: 101 mmol/L (ref 96–106)
Creatinine, Ser: 1.52 mg/dL — ABNORMAL HIGH (ref 0.76–1.27)
Globulin, Total: 3.2 g/dL (ref 1.5–4.5)
Glucose: 91 mg/dL (ref 70–99)
Potassium: 4.3 mmol/L (ref 3.5–5.2)
Sodium: 140 mmol/L (ref 134–144)
Total Protein: 7.9 g/dL (ref 6.0–8.5)
eGFR: 52 mL/min/1.73 — ABNORMAL LOW

## 2024-08-04 ENCOUNTER — Telehealth: Payer: Self-pay | Admitting: Nurse Practitioner

## 2024-08-04 NOTE — Telephone Encounter (Signed)
 Patient called answering service this am. Tells me bowel prep for colonoscopy  was not at pharmacy.   I called in Sup[facility] to [facility] on [address]

## 2024-08-04 NOTE — Progress Notes (Unsigned)
 [facility] History and Physical   Primary Care Physician:  Paseda, Folashade R, FNP   Reason for Procedure:  Colon cancer screening  Plan:    Colonoscopy   The patient was provided an opportunity to ask questions and all were answered. The patient agreed with the plan.   HPI: Timothy Harrington is a 59 y.o. male here for a screening colonoscopy   Past Medical History:  Diagnosis Date   Arthritis    CKD (chronic kidney disease) stage 2, GFR 60-89 ml/min 04/27/2022   Hyperlipidemia LDL goal <100    Hypertension    Kidney stones     Past Surgical History:  Procedure Laterality Date   KIDNEY STONE SURGERY       Current Outpatient Medications  Medication Sig Dispense Refill   amLODipine  (NORVASC ) 2.5 MG tablet TAKE 1 TABLET BY MOUTH EVERY DAY 90 tablet 1   atorvastatin  (LIPITOR) 20 MG tablet Take 1 tablet (20 mg total) by mouth daily. 90 tablet 1   cyclobenzaprine  (FLEXERIL ) 5 MG tablet Take 1 tablet (5 mg total) by mouth 3 (three) times daily as needed for muscle spasms. 30 tablet 1   ibuprofen  (ADVIL ) 200 MG tablet Take 200 mg by mouth every 6 (six) hours as needed.     lidocaine  (LIDODERM ) 5 % PLACE 1 PATCH ONTO THE SKIN DAILY. REMOVE & DISCARD PATCH WITHIN 12 HOURS OR AS DIRECTED BY MD 30 patch 0   sildenafil  (VIAGRA ) 100 MG tablet TAKE 1 TABLET BY MOUTH EVERY DAY AS NEEDED FOR ERECTILE DYSFUNCTION 10 tablet 1   valsartan  (DIOVAN ) 160 MG tablet Take 1 tablet (160 mg total) by mouth daily. 90 tablet 3   No current facility-administered medications for this visit.    Allergies as of 08/05/2024   (No Known Allergies)    Family History  Problem Relation Age of Onset   Stomach cancer Mother    Diabetes Mother    Kidney disease Father    Heart disease Sister    Cancer Neg Hx    Hypertension Neg Hx    Colon cancer Neg Hx    Colon polyps Neg Hx    Esophageal cancer Neg Hx    Rectal cancer Neg Hx     Social History   Socioeconomic History   Marital  status: Legally Separated    Spouse name: Not on file   Number of children: 3   Years of education: Not on file   Highest education level: Some college, no degree  Occupational History   Not on file  Tobacco Use   Smoking status: Some Days    Current packs/day: 0.10    Types: Cigarettes   Smokeless tobacco: Never   Tobacco comments:    2 cigs per day  Vaping Use   Vaping status: Never Used  Substance and Sexual Activity   Alcohol use: Yes    Comment: Occassionally.   Drug use: Yes    Frequency: 3.0 times per week    Types: Marijuana    Comment: Sometimes   Sexual activity: Yes    Birth control/protection: None  Other Topics Concern   Not on file  Social History Narrative   Right handed. Lives wit his daughter.    Social Drivers of Health   Tobacco Use: High Risk (08/02/2024)   Patient History    Smoking Tobacco Use: Some Days    Smokeless Tobacco Use: Never    Passive Exposure: Not on file  Financial Resource Strain: Low Risk (  12/28/2022)   Overall Financial Resource Strain (CARDIA)    Difficulty of Paying Living Expenses: Not hard at all  Food Insecurity: No Food Insecurity (08/02/2024)   Epic    Worried About Programme Researcher, Broadcasting/film/video in the Last Year: Never true    Ran Out of Food in the Last Year: Never true  Transportation Needs: No Transportation Needs (08/02/2024)   Epic    Lack of Transportation (Medical): No    Lack of Transportation (Non-Medical): No  Physical Activity: Insufficiently Active (12/28/2022)   Exercise Vital Sign    Days of Exercise per Week: 3 days    Minutes of Exercise per Session: 30 min  Stress: No Stress Concern Present (12/28/2022)   [employer] of Occupational [employer] - Occupational Stress Questionnaire    Feeling of Stress : Only a little  Social Connections: Moderately Isolated (12/28/2022)   Social Connection and Isolation Panel    Frequency of Communication with Friends and Family: More than three times a week    Frequency of Social  Gatherings with Friends and Family: Three times a week    Attends Religious Services: 1 to 4 times per year    Active Member of Clubs or Organizations: No    Attends Banker Meetings: Not on file    Marital Status: Separated  Intimate Partner Violence: Not At Risk (08/02/2024)   Epic    Fear of Current or Ex-Partner: No    Emotionally Abused: No    Physically Abused: No    Sexually Abused: No  Depression (PHQ2-9): Low Risk (08/02/2024)   Depression (PHQ2-9)    PHQ-2 Score: 0  Alcohol Screen: Low Risk (12/28/2022)   Alcohol Screen    Last Alcohol Screening Score (AUDIT): 7  Housing: Low Risk (08/02/2024)   Epic    Unable to Pay for Housing in the Last Year: No    Number of Times Moved in the Last Year: 0    Homeless in the Last Year: No  Utilities: Not At Risk (08/02/2024)   Epic    Threatened with loss of utilities: No  Health Literacy: Not on file    Review of Systems: Positive for *** All other review of systems negative except as mentioned in the HPI.  Physical Exam: Vital signs There were no vitals taken for this visit.  General:   Alert,  Well-developed, well-nourished, pleasant and cooperative in NAD Lungs:  Clear throughout to auscultation.   Heart:  Regular rate and rhythm; no murmurs, clicks, rubs,  or gallops. Abdomen:  Soft, nontender and nondistended. Normal bowel sounds.   Neuro/Psych:  Alert and cooperative. Normal mood and affect. A and O x 3   @Mahoganie Basher  CHARLENA Commander, MD, [facility] [phone] (pager) 08/04/2024 6:01 PM@

## 2024-08-05 ENCOUNTER — Ambulatory Visit: Admitting: Internal Medicine

## 2024-08-05 ENCOUNTER — Encounter: Payer: Self-pay | Admitting: Internal Medicine

## 2024-08-05 VITALS — BP 132/90 | HR 77 | Temp 98.1°F | Resp 14 | Ht 69.5 in | Wt 185.0 lb

## 2024-08-05 DIAGNOSIS — K635 Polyp of colon: Secondary | ICD-10-CM | POA: Diagnosis not present

## 2024-08-05 DIAGNOSIS — D125 Benign neoplasm of sigmoid colon: Secondary | ICD-10-CM

## 2024-08-05 DIAGNOSIS — D123 Benign neoplasm of transverse colon: Secondary | ICD-10-CM | POA: Diagnosis not present

## 2024-08-05 DIAGNOSIS — Z1211 Encounter for screening for malignant neoplasm of colon: Secondary | ICD-10-CM | POA: Diagnosis present

## 2024-08-05 DIAGNOSIS — D124 Benign neoplasm of descending colon: Secondary | ICD-10-CM

## 2024-08-05 MED ORDER — SODIUM CHLORIDE 0.9 % IV SOLN: 500.0000 mL | INTRAVENOUS | Status: DC

## 2024-08-05 NOTE — Progress Notes (Signed)
 Pt's states no medical or surgical changes since previsit or office visit.

## 2024-08-05 NOTE — Progress Notes (Signed)
 Called to room to assist during endoscopic procedure.  Patient ID and intended procedure confirmed with present staff. Received instructions for my participation in the procedure from the performing physician.

## 2024-08-05 NOTE — Patient Instructions (Addendum)
 I found the removed 2 polyps.  1 was medium-large the other was tiny.  Both look benign.  No aspirin, ibuprofen , naproxen  or other non-steroidal anti-inflammatory drugs for 2 weeks  All else looked normal.  I will let you know pathology results and when to have another routine colonoscopy by mail and/or My Chart.  I appreciate the opportunity to care for you. Lupita CHARLENA Commander, MD, FACG  YOU HAD AN ENDOSCOPIC PROCEDURE TODAY AT THE [facility]:   Refer to the procedure report that was given to you for any specific questions about what was found during the examination.  If the procedure report does not answer your questions, please call your gastroenterologist to clarify.  If you requested that your care partner not be given the details of your procedure findings, then the procedure report has been included in a sealed envelope for you to review at your convenience later.  YOU SHOULD EXPECT: Some feelings of bloating in the abdomen. Passage of more gas than usual.  Walking can help get rid of the air that was put into your GI tract during the procedure and reduce the bloating. If you had a lower endoscopy (such as a colonoscopy or flexible sigmoidoscopy) you may notice spotting of blood in your stool or on the toilet paper. If you underwent a bowel prep for your procedure, you may not have a normal bowel movement for a few days.  Please Note:  You might notice some irritation and congestion in your nose or some drainage.  This is from the oxygen used during your procedure.  There is no need for concern and it should clear up in a day or so.  SYMPTOMS TO REPORT IMMEDIATELY:  Following lower endoscopy (colonoscopy or flexible sigmoidoscopy):  Excessive amounts of blood in the stool  Significant tenderness or worsening of abdominal pains  Swelling of the abdomen that is new, acute  Fever of 100F or higher   For urgent or emergent issues, a gastroenterologist can be reached at any  hour by calling [phone]. Do not use MyChart messaging for urgent concerns.    DIET:  We do recommend a small meal at first, but then you may proceed to your regular diet.  Drink plenty of fluids but you should avoid alcoholic beverages for 24 hours.  ACTIVITY:  You should plan to take it easy for the rest of today and you should NOT DRIVE or use heavy machinery until tomorrow (because of the sedation medicines used during the test).    FOLLOW UP: Our staff will call the number listed on your records the next business day following your procedure.  We will call around 7:15- 8:00 am to check on you and address any questions or concerns that you may have regarding the information given to you following your procedure. If we do not reach you, we will leave a message.     If any biopsies were taken you will be contacted by phone or by letter within the next 1-3 weeks.  Please call us  at [phone] if you have not heard about the biopsies in 3 weeks.    SIGNATURES/CONFIDENTIALITY: You and/or your care partner have signed paperwork which will be entered into your electronic medical record.  These signatures attest to the fact that that the information above on your After Visit Summary has been reviewed and is understood.  Full responsibility of the confidentiality of this discharge information lies with you and/or your care-partner.

## 2024-08-05 NOTE — Op Note (Signed)
 [facility] Patient Name: Timothy Harrington Procedure Date: 08/05/2024 2:44 PM MRN: [id number] Endoscopist: Lupita FORBES Commander , MD, [id number] Age: 59 Referring MD:  Date of Birth: 05-16-1966 Gender: Male Account #: [id number] Procedure:                Colonoscopy Indications:              Screening for colorectal malignant neoplasm Medicines:                Monitored Anesthesia Care Procedure:                Pre-Anesthesia Assessment:                           - Prior to the procedure, a History and Physical                            was performed, and patient medications and                            allergies were reviewed. The patient's tolerance of                            previous anesthesia was also reviewed. The risks                            and benefits of the procedure and the sedation                            options and risks were discussed with the patient.                            All questions were answered, and informed consent                            was obtained. Prior Anticoagulants: The patient has                            taken no anticoagulant or antiplatelet agents. ASA                            Grade Assessment: II - A patient with mild systemic                            disease. After reviewing the risks and benefits,                            the patient was deemed in satisfactory condition to                            undergo the procedure.                           After obtaining informed consent, the colonoscope  was passed under direct vision. Throughout the                            procedure, the patient's blood pressure, pulse, and                            oxygen saturations were monitored continuously. The                            CF [id number] #[id number] was introduced through the anus                            and advanced to the the cecum, identified by                            appendiceal orifice  and ileocecal valve. The                            colonoscopy was performed without difficulty. The                            patient tolerated the procedure well. The quality                            of the bowel preparation was adequate. The                            ileocecal valve, appendiceal orifice, and rectum                            were photographed. The bowel preparation used was                            SUPREP via split dose instruction. Scope In: 3:07:11 PM Scope Out: 3:22:23 PM Scope Withdrawal Time: 0 hours 12 minutes 47 seconds  Total Procedure Duration: 0 hours 15 minutes 12 seconds  Findings:                 The perianal and digital rectal examinations were                            normal. Pertinent negatives include normal prostate                            (size, shape, and consistency).                           An 18 mm polyp was found in the proximal transverse                            colon. The polyp was semi-pedunculated. The polyp                            was removed with a hot snare. Resection and  retrieval were complete. Verification of patient                            identification for the specimen was done. Estimated                            blood loss: none.                           A 3 mm polyp was found in the sigmoid colon. The                            polyp was flat. The polyp was removed with a cold                            snare. Resection and retrieval were complete.                            Verification of patient identification for the                            specimen was done. Estimated blood loss was minimal.                           The exam was otherwise without abnormality on                            direct and retroflexion views. Complications:            No immediate complications. Estimated Blood Loss:     Estimated blood loss was minimal. Impression:               - One 18 mm  polyp in the proximal transverse colon,                            removed with a hot snare. Resected and retrieved.                           - One 3 mm polyp in the sigmoid colon, removed with                            a cold snare. Resected and retrieved.                           - The examination was otherwise normal on direct                            and retroflexion views. Recommendation:           - Patient has a contact number available for                            emergencies. The signs and symptoms of potential  delayed complications were discussed with the                            patient. Return to normal activities tomorrow.                            Written discharge instructions were provided to the                            patient.                           - Resume previous diet.                           - Continue present medications.                           - No aspirin, ibuprofen , naproxen , or other                            non-steroidal anti-inflammatory drugs for 2 weeks                            after polyp removal.                           - Repeat colonoscopy is recommended. The                            colonoscopy date will be determined after pathology                            results from today's exam become available for                            review. Lupita FORBES Commander, MD 08/05/2024 3:30:19 PM This report has been signed electronically.

## 2024-08-05 NOTE — Progress Notes (Signed)
 Report to PACU, RN, vss, BBS= Clear.

## 2024-08-06 ENCOUNTER — Telehealth: Payer: Self-pay

## 2024-08-06 NOTE — Telephone Encounter (Signed)
" °  Follow up Call-     08/05/2024    1:56 PM  Call back number  Post procedure Call Back phone  # [phone]  Permission to leave phone message Yes     Patient questions:  Do you have a fever, pain , or abdominal swelling? No. Pain Score  0 *  Have you tolerated food without any problems? Yes.    Have you been able to return to your normal activities? Yes.    Do you have any questions about your discharge instructions: Diet   No. Medications  No. Follow up visit  No.  Do you have questions or concerns about your Care? No.  Actions: * If pain score is 4 or above: No action needed, pain <4.   "

## 2024-08-08 LAB — SURGICAL PATHOLOGY

## 2024-08-14 ENCOUNTER — Encounter: Payer: Self-pay | Admitting: Internal Medicine

## 2024-08-14 ENCOUNTER — Ambulatory Visit: Payer: Self-pay | Admitting: Internal Medicine

## 2024-08-14 DIAGNOSIS — Z860101 Personal history of adenomatous and serrated colon polyps: Secondary | ICD-10-CM | POA: Insufficient documentation

## 2024-11-29 ENCOUNTER — Ambulatory Visit: Payer: Self-pay | Admitting: Nurse Practitioner
